# Patient Record
Sex: Female | Born: 1990 | Race: White | Hispanic: No | Marital: Married | Smoking: Never smoker
Health system: Southern US, Community
[De-identification: ages and names within clinical notes are randomized; demographics above are authoritative.]

## PROBLEM LIST (undated history)

## (undated) ENCOUNTER — Inpatient Hospital Stay (HOSPITAL_COMMUNITY): Payer: Self-pay

## (undated) DIAGNOSIS — Z8742 Personal history of other diseases of the female genital tract: Secondary | ICD-10-CM

## (undated) DIAGNOSIS — Z789 Other specified health status: Secondary | ICD-10-CM

## (undated) DIAGNOSIS — R3915 Urgency of urination: Secondary | ICD-10-CM

## (undated) DIAGNOSIS — Z973 Presence of spectacles and contact lenses: Secondary | ICD-10-CM

## (undated) DIAGNOSIS — T8859XA Other complications of anesthesia, initial encounter: Secondary | ICD-10-CM

## (undated) DIAGNOSIS — N201 Calculus of ureter: Secondary | ICD-10-CM

## (undated) DIAGNOSIS — Z8619 Personal history of other infectious and parasitic diseases: Secondary | ICD-10-CM

## (undated) DIAGNOSIS — T4145XA Adverse effect of unspecified anesthetic, initial encounter: Secondary | ICD-10-CM

## (undated) DIAGNOSIS — Z87442 Personal history of urinary calculi: Secondary | ICD-10-CM

## (undated) HISTORY — DX: Personal history of other infectious and parasitic diseases: Z86.19

## (undated) HISTORY — PX: LITHOTRIPSY: SUR834

## (undated) HISTORY — PX: WISDOM TOOTH EXTRACTION: SHX21

## (undated) HISTORY — DX: Personal history of other diseases of the female genital tract: Z87.42

---
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
## 2014-10-29 ENCOUNTER — Encounter: Payer: Self-pay | Admitting: Podiatry

## 2014-10-29 ENCOUNTER — Ambulatory Visit (INDEPENDENT_AMBULATORY_CARE_PROVIDER_SITE_OTHER): Payer: BLUE CROSS/BLUE SHIELD | Admitting: Podiatry

## 2014-10-29 VITALS — BP 76/49 | HR 56 | Resp 18

## 2014-10-29 DIAGNOSIS — L600 Ingrowing nail: Secondary | ICD-10-CM

## 2014-10-29 DIAGNOSIS — L03032 Cellulitis of left toe: Secondary | ICD-10-CM | POA: Diagnosis not present

## 2014-10-29 DIAGNOSIS — L03031 Cellulitis of right toe: Secondary | ICD-10-CM

## 2014-10-29 NOTE — Progress Notes (Signed)
   Subjective:    Patient ID: Crystal Abbott, female    DOB: Nov 01, 1990, 24 y.o.   MRN: [id number]  HPI I HAVE INGROWN TOENAILS ON BOTH BIG TOES AND THEY HAVE BEEN GOING ON FOR ABOUT 6 MONTHS AND THEY DO DRAIN, SORE, TENDER This patient presents with history of ingrowing toenails on the outside border both big toes.  She says she has been unable to self treat these nails and pus is seen coming out from her nails.  She presents for definitive evaluation and treatment.   Review of Systems  Constitutional: Positive for unexpected weight change.  All other systems reviewed and are negative.      Objective:   Physical Exam GENERAL APPEARANCE: Alert, conversant. Appropriately groomed. No acute distress.  VASCULAR: Pedal pulses palpable at 2/4 DP and PT bilateral.  Capillary refill time is immediate to all digits,  Proximal to distal cooling it warm to warm.  Digital hair growth is present bilateral  NEUROLOGIC: sensation is intact epicritically and protectively to 5.07 monofilament at 5/5 sites bilateral.  Light touch is intact bilateral, vibratory sensation intact bilateral, achilles tendon reflex is intact bilateral.  MUSCULOSKELETAL: acceptable muscle strength, tone and stability bilateral.  Intrinsic muscluature intact bilateral.  Rectus appearance of foot and digits noted bilateral.   DERMATOLOGIC: skin color, texture, and turgor are within normal limits.  No preulcerative lesions or ulcers  are seen, no interdigital maceration noted.  No open lesions present.  NAILS  There is marked incurvation along the outside border both great toes with granulation tissue noted..         Assessment & Plan:  Ingrowing Toenail Lateral Borders both Hallux  Paronychia lateral border hallux B/L  IE  Nail surgery both hallux nails.   Treatment options and alternatives discussed.  Recommended an incision and drainage and patient agreed.  Right and left great toes  Were  prepped with alcohol and a 3cc. Of  2%  lidocaine plain was administered in a digital block fashion.  The toes were  then prepped with betadine solution .  The offending nail border was then excised and all necrotic tissue was resected.  The area was then cleansed well and antibiotic ointment and a dry sterile dressing was applied.  The left hallux was performed and the same procedure was then performed on right hallux.  Aftercare instructions were given.  No antibiotics since no infection seen.  Aftercare instructions were given.

## 2014-11-05 ENCOUNTER — Ambulatory Visit (INDEPENDENT_AMBULATORY_CARE_PROVIDER_SITE_OTHER): Payer: BLUE CROSS/BLUE SHIELD | Admitting: Podiatry

## 2014-11-05 ENCOUNTER — Encounter: Payer: Self-pay | Admitting: Podiatry

## 2014-11-05 VITALS — BP 115/65 | HR 91 | Resp 18

## 2014-11-05 DIAGNOSIS — Z09 Encounter for follow-up examination after completed treatment for conditions other than malignant neoplasm: Secondary | ICD-10-CM

## 2014-11-05 NOTE — Progress Notes (Signed)
Subjective:     Patient ID: Crystal Abbott, female   DOB: 1990-05-07, 24 y.o.   MRN: [id number]  HPIShe returns to the office saying her right big toe is doing well but there is drainage and redness in left big toe but nail is better.  She has been soaking and bandaging as directed.  Pain is minimal.     Review of Systems     Objective:   Physical Exam GENERAL APPEARANCE: Alert, conversant. Appropriately groomed. No acute distress.  VASCULAR: Pedal pulses palpable at 2/4 DP and PT bilateral.  Capillary refill time is immediate to all digits,  Proximal to distal cooling it warm to warm.  Digital hair growth is present bilateral  NEUROLOGIC: sensation is intact epicritically and protectively to 5.07 monofilament at 5/5 sites bilateral.  Light touch is intact bilateral, vibratory sensation intact bilateral, achilles tendon reflex is intact bilateral.  MUSCULOSKELETAL: acceptable muscle strength, tone and stability bilateral.  Intrinsic muscluature intact bilateral.  Rectus appearance of foot and digits noted bilateral.   DERMATOLOGIC: skin color, texture, and turgor are within normal limits.  No preulcerative lesions or ulcers  are seen, no interdigital maceration noted.  No open lesions present.  NAILS  There is necrotic tissue along lateral border right big toe with no drainage.  Thee is necrotic tissue along the outside border left hallux with necrotic tissue with clear drainage.      Assessment:     S/p mail surgery     Plan:     ROV  Home instructions given

## 2015-02-01 LAB — OB RESULTS CONSOLE HIV ANTIBODY (ROUTINE TESTING): HIV: NONREACTIVE

## 2015-02-01 LAB — OB RESULTS CONSOLE GC/CHLAMYDIA
CHLAMYDIA, DNA PROBE: NEGATIVE
GC PROBE AMP, GENITAL: NEGATIVE

## 2015-02-01 LAB — OB RESULTS CONSOLE RUBELLA ANTIBODY, IGM: RUBELLA: IMMUNE

## 2015-02-01 LAB — OB RESULTS CONSOLE ABO/RH: RH Type: POSITIVE

## 2015-02-01 LAB — OB RESULTS CONSOLE HEPATITIS B SURFACE ANTIGEN: HEP B S AG: NEGATIVE

## 2015-02-01 LAB — OB RESULTS CONSOLE ANTIBODY SCREEN: Antibody Screen: NEGATIVE

## 2015-02-01 LAB — OB RESULTS CONSOLE RPR: RPR: NONREACTIVE

## 2015-04-07 NOTE — L&D Delivery Note (Signed)
Patient was C/C/+5 and pushed for 5 minutes with epidural.   NSVD  female infant, Apgars 8,9, weight P.   The patient had a tiny first degree perineal laceration repaired with 3-0 vicryl R. Fundus was firm. EBL was expected amount. Placenta was delivered intact. Vagina was clear.  Baby was vigorous and doing skin to skin with mother.  Crystal Abbott A

## 2015-07-06 ENCOUNTER — Encounter (HOSPITAL_COMMUNITY): Payer: Self-pay | Admitting: *Deleted

## 2015-07-06 ENCOUNTER — Inpatient Hospital Stay (HOSPITAL_COMMUNITY)
Admission: AD | Admit: 2015-07-06 | Discharge: 2015-07-06 | Disposition: A | Discharge status: Home / Self Care | Payer: 59 | Source: Ambulatory Visit | Attending: Obstetrics and Gynecology | Admitting: Obstetrics and Gynecology

## 2015-07-06 DIAGNOSIS — Z880 Allergy status to penicillin: Secondary | ICD-10-CM | POA: Insufficient documentation

## 2015-07-06 DIAGNOSIS — K529 Noninfective gastroenteritis and colitis, unspecified: Secondary | ICD-10-CM | POA: Diagnosis not present

## 2015-07-06 DIAGNOSIS — Z3A34 34 weeks gestation of pregnancy: Secondary | ICD-10-CM | POA: Insufficient documentation

## 2015-07-06 DIAGNOSIS — O212 Late vomiting of pregnancy: Secondary | ICD-10-CM | POA: Diagnosis present

## 2015-07-06 DIAGNOSIS — O99613 Diseases of the digestive system complicating pregnancy, third trimester: Secondary | ICD-10-CM | POA: Insufficient documentation

## 2015-07-06 HISTORY — DX: Other specified health status: Z78.9

## 2015-07-06 LAB — URINALYSIS, ROUTINE W REFLEX MICROSCOPIC
BILIRUBIN URINE: NEGATIVE
Glucose, UA: NEGATIVE mg/dL
Hgb urine dipstick: NEGATIVE
KETONES UR: 15 mg/dL — AB
NITRITE: NEGATIVE
PH: 6 (ref 5.0–8.0)
PROTEIN: NEGATIVE mg/dL
Specific Gravity, Urine: 1.02 (ref 1.005–1.030)

## 2015-07-06 LAB — URINE MICROSCOPIC-ADD ON: Bacteria, UA: NONE SEEN

## 2015-07-06 MED ORDER — ONDANSETRON 8 MG PO TBDP
8.0000 mg | ORAL_TABLET | Freq: Once | ORAL | Status: AC
  Administered 2015-07-06: 8 mg via ORAL
  Filled 2015-07-06: qty 1

## 2015-07-06 MED ORDER — ONDANSETRON 4 MG PO TBDP: 4.0000 mg | ORAL_TABLET | Freq: Three times a day (TID) | ORAL | Status: DC | PRN

## 2015-07-06 NOTE — MAU Note (Signed)
N/v/d since 2a

## 2015-07-06 NOTE — MAU Provider Note (Signed)
History     CSN: [id number]  Arrival date and time: 07/06/15 8295   None     Chief Complaint  Patient presents with  . Contractions  . Emesis During Pregnancy  . Diarrhea   HPI   Ms.Cathlin Z Germain is a 25 y.o. female G2P1001 at [redacted]w[redacted]d presenting to MAU with N/V/D that started around 0200.  She has vomited twice, and has had one episode of diarrhea. The patient drove herself her to MAU.   + fetal movement Denies bleeding or leaking of fluid Denies fever  OB History    Gravida Para Term Preterm AB TAB SAB Ectopic Multiple Living   0 0 0 0 0 1      Past Medical History  Diagnosis Date  . Medical history non-contributory     Past Surgical History  Procedure Laterality Date  . Wisdom tooth extraction      No family history on file.  Social History  Substance Use Topics  . Smoking status: Never Smoker   . Smokeless tobacco: Never Used  . Alcohol Use: No    Allergies:  Allergies  Allergen Reactions  . Penicillins Rash    Has patient had a PCN reaction causing immediate rash, facial/tongue/throat swelling, SOB or lightheadedness with hypotension: Yes Has patient had a PCN reaction causing severe rash involving mucus membranes or skin necrosis: Yes Has patient had a PCN reaction that required hospitalization No Has patient had a PCN reaction occurring within the last 10 years: No If all of the above answers are "NO", then may proceed with Cephalosporin use.     Prescriptions prior to admission  Medication Sig Dispense Refill Last Dose  . Prenatal Vit-Fe Fumarate-FA (MULTIVITAMIN-PRENATAL) 27-0.8 MG TABS tablet Take 1 tablet by mouth daily at 12 noon.   07/05/2015 at Unknown time   Recent Results (from the past 2160 hour(s))  Urinalysis, Routine w reflex microscopic (not at [facility])     Status: Abnormal   Collection Time: 07/06/15  9:25 AM  Result Value Ref Range   Color, Urine YELLOW YELLOW   APPearance CLEAR CLEAR   Specific Gravity, Urine 1.020  1.005 - 1.030   pH 6.0 5.0 - 8.0   Glucose, UA NEGATIVE NEGATIVE mg/dL   Hgb urine dipstick NEGATIVE NEGATIVE   Bilirubin Urine NEGATIVE NEGATIVE   Ketones, ur 15 (A) NEGATIVE mg/dL   Protein, ur NEGATIVE NEGATIVE mg/dL   Nitrite NEGATIVE NEGATIVE   Leukocytes, UA SMALL (A) NEGATIVE  Urine microscopic-add on     Status: Abnormal   Collection Time: 07/06/15  9:25 AM  Result Value Ref Range   Squamous Epithelial / LPF 0-5 (A) NONE SEEN   WBC, UA 0-5 0 - 5 WBC/hpf   RBC / HPF 0-5 0 - 5 RBC/hpf   Bacteria, UA NONE SEEN NONE SEEN   Urine-Other MUCOUS PRESENT     Review of Systems  Constitutional: Negative for fever and chills.  Gastrointestinal: Positive for nausea, vomiting, abdominal pain and diarrhea.   Physical Exam   Blood pressure 125/66, pulse 96, temperature 97.6 F (36.4 C), resp. rate 18, height  (1.626 m), weight 237 lb 6.4 oz (107.684 kg).  Physical Exam  Constitutional: She is oriented to person, place, and time. She appears well-developed and well-nourished. No distress.  HENT:  Head: Normocephalic.  GI: Soft. There is no tenderness.  Genitourinary:  Cervix: closed, thick, middle   Neurological: She is alert and oriented to person, place, and time.  Skin: Skin is warm. She is not diaphoretic.  Psychiatric: Her behavior is normal.   Fetal Tracing: Baseline: 130 bpm  Variability: Moderate  Accelerations: 15x15 Decelerations: quick variables  Toco: Quiet   MAU Course  Procedures None  MDM  Zofran 8 mg ODT PO fluid challenge: pass Patient has not vomited since her arrival Discussed HPI, labs, fetal tracing and course of treatment in MAU.   Assessment and Plan   A:  1. Gastroenteritis, acute     P:  Discharge home in stable condition RX: Zofran Follow up with OB as scheduled Fetal kick counts BRAT diet  Increase po fluid intake     Duane LopeJennifer I Rasch, NP 07/06/2015 1:17 PM

## 2015-07-06 NOTE — Discharge Instructions (Signed)

## 2015-07-16 LAB — OB RESULTS CONSOLE GBS: GBS: POSITIVE

## 2015-08-14 ENCOUNTER — Other Ambulatory Visit: Payer: Self-pay | Admitting: Obstetrics and Gynecology

## 2015-08-15 ENCOUNTER — Encounter (HOSPITAL_COMMUNITY): Payer: Self-pay | Admitting: *Deleted

## 2015-08-15 ENCOUNTER — Telehealth (HOSPITAL_COMMUNITY): Payer: Self-pay | Admitting: *Deleted

## 2015-08-16 ENCOUNTER — Inpatient Hospital Stay (HOSPITAL_COMMUNITY)
Admission: AD | Admit: 2015-08-16 | Discharge: 2015-08-17 | DRG: 775 | Disposition: A | Discharge status: Home / Self Care | Payer: 59 | Source: Ambulatory Visit | Attending: Obstetrics and Gynecology | Admitting: Obstetrics and Gynecology

## 2015-08-16 ENCOUNTER — Inpatient Hospital Stay (HOSPITAL_COMMUNITY): Payer: 59 | Admitting: Anesthesiology

## 2015-08-16 ENCOUNTER — Encounter (HOSPITAL_COMMUNITY): Payer: Self-pay | Admitting: *Deleted

## 2015-08-16 DIAGNOSIS — O99824 Streptococcus B carrier state complicating childbirth: Principal | ICD-10-CM | POA: Diagnosis present

## 2015-08-16 DIAGNOSIS — O99334 Smoking (tobacco) complicating childbirth: Secondary | ICD-10-CM | POA: Diagnosis present

## 2015-08-16 DIAGNOSIS — Z3A40 40 weeks gestation of pregnancy: Secondary | ICD-10-CM | POA: Diagnosis not present

## 2015-08-16 DIAGNOSIS — Z3490 Encounter for supervision of normal pregnancy, unspecified, unspecified trimester: Secondary | ICD-10-CM

## 2015-08-16 DIAGNOSIS — Z880 Allergy status to penicillin: Secondary | ICD-10-CM

## 2015-08-16 HISTORY — DX: Encounter for supervision of normal pregnancy, unspecified, unspecified trimester: Z34.90

## 2015-08-16 LAB — CBC
HEMATOCRIT: 32.5 % — AB (ref 36.0–46.0)
HEMOGLOBIN: 10.5 g/dL — AB (ref 12.0–15.0)
MCH: 25.2 pg — AB (ref 26.0–34.0)
MCHC: 32.3 g/dL (ref 30.0–36.0)
MCV: 78.1 fL (ref 78.0–100.0)
Platelets: 236 10*3/uL (ref 150–400)
RBC: 4.16 MIL/uL (ref 3.87–5.11)
RDW: 15.4 % (ref 11.5–15.5)
WBC: 10.3 10*3/uL (ref 4.0–10.5)

## 2015-08-16 LAB — TYPE AND SCREEN
ABO/RH(D): B POS
Antibody Screen: NEGATIVE

## 2015-08-16 LAB — ABO/RH: ABO/RH(D): B POS

## 2015-08-16 MED ORDER — OXYTOCIN 40 UNITS IN LACTATED RINGERS INFUSION - SIMPLE MED
1.0000 m[IU]/min | INTRAVENOUS | Status: DC
  Filled 2015-08-16: qty 1000

## 2015-08-16 MED ORDER — PRENATAL MULTIVITAMIN CH
1.0000 | ORAL_TABLET | Freq: Every day | ORAL | Status: DC
  Administered 2015-08-17: 1 via ORAL
  Filled 2015-08-16 (×2): qty 1

## 2015-08-16 MED ORDER — DIBUCAINE 1 % RE OINT: 1.0000 "application " | TOPICAL_OINTMENT | RECTAL | Status: DC | PRN

## 2015-08-16 MED ORDER — TETANUS-DIPHTH-ACELL PERTUSSIS 5-2.5-18.5 LF-MCG/0.5 IM SUSP: 0.5000 mL | Freq: Once | INTRAMUSCULAR | Status: DC

## 2015-08-16 MED ORDER — OXYCODONE-ACETAMINOPHEN 5-325 MG PO TABS: 1.0000 | ORAL_TABLET | ORAL | Status: DC | PRN

## 2015-08-16 MED ORDER — LIDOCAINE HCL (PF) 1 % IJ SOLN
30.0000 mL | INTRAMUSCULAR | Status: DC | PRN
  Filled 2015-08-16: qty 30

## 2015-08-16 MED ORDER — OXYCODONE-ACETAMINOPHEN 5-325 MG PO TABS: 2.0000 | ORAL_TABLET | ORAL | Status: DC | PRN

## 2015-08-16 MED ORDER — LACTATED RINGERS IV SOLN
INTRAVENOUS | Status: DC
  Administered 2015-08-16: 18:00:00 via INTRAVENOUS

## 2015-08-16 MED ORDER — WITCH HAZEL-GLYCERIN EX PADS: 1.0000 "application " | MEDICATED_PAD | CUTANEOUS | Status: DC | PRN

## 2015-08-16 MED ORDER — FENTANYL 2.5 MCG/ML BUPIVACAINE 1/10 % EPIDURAL INFUSION (WH - ANES)
14.0000 mL/h | INTRAMUSCULAR | Status: DC | PRN
  Administered 2015-08-16 (×2): 14 mL/h via EPIDURAL
  Filled 2015-08-16: qty 125

## 2015-08-16 MED ORDER — COCONUT OIL OIL
1.0000 | TOPICAL_OIL | Status: DC | PRN
Start: 2015-08-16 — End: 2015-08-18

## 2015-08-16 MED ORDER — BISACODYL 10 MG RE SUPP: 10.0000 mg | Freq: Every day | RECTAL | Status: DC | PRN

## 2015-08-16 MED ORDER — SENNOSIDES-DOCUSATE SODIUM 8.6-50 MG PO TABS
2.0000 | ORAL_TABLET | ORAL | Status: DC
  Administered 2015-08-17: 2 via ORAL
  Filled 2015-08-16: qty 2

## 2015-08-16 MED ORDER — LACTATED RINGERS IV SOLN
500.0000 mL | INTRAVENOUS | Status: DC | PRN
  Administered 2015-08-16: 300 mL via INTRAVENOUS

## 2015-08-16 MED ORDER — LIDOCAINE HCL (PF) 1 % IJ SOLN
INTRAMUSCULAR | Status: DC | PRN
  Administered 2015-08-16 (×2): 5 mL

## 2015-08-16 MED ORDER — OXYCODONE HCL 5 MG PO TABS: 10.0000 mg | ORAL_TABLET | ORAL | Status: DC | PRN

## 2015-08-16 MED ORDER — EPHEDRINE 5 MG/ML INJ
10.0000 mg | INTRAVENOUS | Status: DC | PRN
  Filled 2015-08-16: qty 2
  Filled 2015-08-16: qty 4

## 2015-08-16 MED ORDER — OXYTOCIN BOLUS FROM INFUSION
500.0000 mL | INTRAVENOUS | Status: DC
  Administered 2015-08-16: 500 mL via INTRAVENOUS

## 2015-08-16 MED ORDER — SIMETHICONE 80 MG PO CHEW
80.0000 mg | CHEWABLE_TABLET | Freq: Three times a day (TID) | ORAL | Status: DC
  Filled 2015-08-16: qty 1

## 2015-08-16 MED ORDER — OXYTOCIN 40 UNITS IN LACTATED RINGERS INFUSION - SIMPLE MED: 2.5000 [IU]/h | INTRAVENOUS | Status: AC

## 2015-08-16 MED ORDER — CEFAZOLIN SODIUM 1-5 GM-% IV SOLN
1.0000 g | Freq: Three times a day (TID) | INTRAVENOUS | Status: DC
  Filled 2015-08-16: qty 50

## 2015-08-16 MED ORDER — METHYLERGONOVINE MALEATE 0.2 MG/ML IJ SOLN: 0.2000 mg | INTRAMUSCULAR | Status: DC | PRN

## 2015-08-16 MED ORDER — LACTATED RINGERS IV SOLN: INTRAVENOUS | Status: DC

## 2015-08-16 MED ORDER — OXYTOCIN 40 UNITS IN LACTATED RINGERS INFUSION - SIMPLE MED: 2.5000 [IU]/h | INTRAVENOUS | Status: DC

## 2015-08-16 MED ORDER — TERBUTALINE SULFATE 1 MG/ML IJ SOLN
0.2500 mg | Freq: Once | INTRAMUSCULAR | Status: DC | PRN
  Filled 2015-08-16: qty 1

## 2015-08-16 MED ORDER — IBUPROFEN 600 MG PO TABS
600.0000 mg | ORAL_TABLET | Freq: Four times a day (QID) | ORAL | Status: DC
  Administered 2015-08-17 (×4): 600 mg via ORAL
  Filled 2015-08-16 (×4): qty 1

## 2015-08-16 MED ORDER — SIMETHICONE 80 MG PO CHEW: 80.0000 mg | CHEWABLE_TABLET | ORAL | Status: DC | PRN

## 2015-08-16 MED ORDER — CEFAZOLIN SODIUM-DEXTROSE 2-4 GM/100ML-% IV SOLN
2.0000 g | Freq: Once | INTRAVENOUS | Status: AC
  Administered 2015-08-16: 2 g via INTRAVENOUS
  Filled 2015-08-16: qty 100

## 2015-08-16 MED ORDER — DIPHENHYDRAMINE HCL 25 MG PO CAPS: 25.0000 mg | ORAL_CAPSULE | Freq: Four times a day (QID) | ORAL | Status: DC | PRN

## 2015-08-16 MED ORDER — FLEET ENEMA 7-19 GM/118ML RE ENEM: 1.0000 | ENEMA | Freq: Every day | RECTAL | Status: DC | PRN

## 2015-08-16 MED ORDER — MENTHOL 3 MG MT LOZG: 1.0000 | LOZENGE | OROMUCOSAL | Status: DC | PRN

## 2015-08-16 MED ORDER — DIPHENHYDRAMINE HCL 50 MG/ML IJ SOLN: 12.5000 mg | INTRAMUSCULAR | Status: DC | PRN

## 2015-08-16 MED ORDER — CITRIC ACID-SODIUM CITRATE 334-500 MG/5ML PO SOLN: 30.0000 mL | ORAL | Status: DC | PRN

## 2015-08-16 MED ORDER — SIMETHICONE 80 MG PO CHEW
80.0000 mg | CHEWABLE_TABLET | ORAL | Status: DC
  Administered 2015-08-17: 80 mg via ORAL
  Filled 2015-08-16: qty 1

## 2015-08-16 MED ORDER — LACTATED RINGERS IV SOLN: 500.0000 mL | Freq: Once | INTRAVENOUS | Status: DC

## 2015-08-16 MED ORDER — MEASLES, MUMPS & RUBELLA VAC ~~LOC~~ INJ: 0.5000 mL | INJECTION | Freq: Once | SUBCUTANEOUS | Status: DC

## 2015-08-16 MED ORDER — PHENYLEPHRINE 40 MCG/ML (10ML) SYRINGE FOR IV PUSH (FOR BLOOD PRESSURE SUPPORT)
80.0000 ug | PREFILLED_SYRINGE | INTRAVENOUS | Status: DC | PRN
  Filled 2015-08-16: qty 5
  Filled 2015-08-16: qty 10

## 2015-08-16 MED ORDER — PHENYLEPHRINE 40 MCG/ML (10ML) SYRINGE FOR IV PUSH (FOR BLOOD PRESSURE SUPPORT)
80.0000 ug | PREFILLED_SYRINGE | INTRAVENOUS | Status: AC | PRN
  Administered 2015-08-16 (×2): 80 ug via INTRAVENOUS
  Administered 2015-08-16: 40 ug via INTRAVENOUS

## 2015-08-16 MED ORDER — FERROUS SULFATE 325 (65 FE) MG PO TABS
325.0000 mg | ORAL_TABLET | Freq: Two times a day (BID) | ORAL | Status: DC
  Administered 2015-08-17 (×2): 325 mg via ORAL
  Filled 2015-08-16 (×2): qty 1

## 2015-08-16 MED ORDER — ACETAMINOPHEN 325 MG PO TABS: 650.0000 mg | ORAL_TABLET | ORAL | Status: DC | PRN

## 2015-08-16 MED ORDER — EPHEDRINE 5 MG/ML INJ
10.0000 mg | INTRAVENOUS | Status: DC | PRN
  Administered 2015-08-16: 5 mg via INTRAVENOUS
  Filled 2015-08-16: qty 2

## 2015-08-16 MED ORDER — ZOLPIDEM TARTRATE 5 MG PO TABS: 5.0000 mg | ORAL_TABLET | Freq: Every evening | ORAL | Status: DC | PRN

## 2015-08-16 MED ORDER — METHYLERGONOVINE MALEATE 0.2 MG PO TABS: 0.2000 mg | ORAL_TABLET | ORAL | Status: DC | PRN

## 2015-08-16 MED ORDER — OXYCODONE HCL 5 MG PO TABS: 5.0000 mg | ORAL_TABLET | ORAL | Status: DC | PRN

## 2015-08-16 MED ORDER — ONDANSETRON HCL 4 MG/2ML IJ SOLN
4.0000 mg | Freq: Four times a day (QID) | INTRAMUSCULAR | Status: DC | PRN
  Administered 2015-08-16: 4 mg via INTRAVENOUS
  Filled 2015-08-16: qty 2

## 2015-08-16 MED ORDER — ACETAMINOPHEN 325 MG PO TABS
650.0000 mg | ORAL_TABLET | ORAL | Status: DC | PRN
  Administered 2015-08-17: 650 mg via ORAL
  Filled 2015-08-16: qty 2

## 2015-08-16 NOTE — Anesthesia Procedure Notes (Signed)
Epidural Patient location during procedure: OB  Staffing Anesthesiologist: Phillips GroutARIGNAN, Katherinne Mofield Performed by: anesthesiologist   Preanesthetic Checklist Completed: patient identified, site marked, surgical consent, pre-op evaluation, timeout performed, IV checked, risks and benefits discussed and monitors and equipment checked  Epidural Patient position: sitting Prep: DuraPrep Patient monitoring: heart rate, continuous pulse ox and blood pressure Approach: midline Location: L3-L4 Injection technique: LOR saline  Needle:  Needle type: Tuohy  Needle gauge: 17 G Needle length: 9 cm and 9 Needle insertion depth: 8 cm Catheter type: closed end flexible Catheter size: 20 Guage Catheter at skin depth: 12 cm Test dose: negative  Assessment Events: blood not aspirated, injection not painful, no injection resistance, negative IV test and no paresthesia  Additional Notes Patient identified. Risks/Benefits/Options discussed with patient including but not limited to bleeding, infection, nerve damage, paralysis, failed block, incomplete pain control, headache, blood pressure changes, nausea, vomiting, reactions to medication both or allergic, itching and postpartum back pain. Confirmed with bedside nurse the patient's most recent platelet count. Confirmed with patient that they are not currently taking any anticoagulation, have any bleeding history or any family history of bleeding disorders. Patient expressed understanding and wished to proceed. All questions were answered. Sterile technique was used throughout the entire procedure. Please see nursing notes for vital signs. Test dose was given through epidural needle and negative prior to continuing to dose epidural or start infusion. Warning signs of high block given to the patient including shortness of breath, tingling/numbness in hands, complete motor block, or any concerning symptoms with instructions to call for help. Patient was given  instructions on fall risk and not to get out of bed. All questions and concerns addressed with instructions to call with any issues.

## 2015-08-16 NOTE — Progress Notes (Signed)
Informed Dr Carmelia Rollercariganof BP, no new orders, cont current interventions

## 2015-08-16 NOTE — H&P (Signed)
25 y.o. 4836w2d  G2P1001 comes in today for routine visit and ANT for post term.  NST R in office but accompanied by 3 mild to moderate variables- cat 2 tracing.  Since pt is favorable, elected to do induction today.  Otherwise has good fetal movement and no bleeding.  Past Medical History  Diagnosis Date  . Medical history non-contributory   . Hx of chlamydia infection   . Hx of varicella   . Hx of endometriosis     Past Surgical History  Procedure Laterality Date  . Wisdom tooth extraction      OB History  Gravida Para Term Preterm AB SAB TAB Ectopic Multiple Living  2 1 1   0 0 0 0 0 1    # Outcome Date GA Lbr Len/2nd Weight Sex Delivery Anes PTL Lv  2 Current           1 Term 2016 2139w0d  3.799 kg (8 lb 6 oz) M Vag-Spont EPI  Y      Social History   Social History  . Marital Status: Married    Spouse Name: N/A  . Number of Children: N/A  . Years of Education: N/A   Occupational History  . Not on file.   Social History Main Topics  . Smoking status: Never Smoker   . Smokeless tobacco: Never Used  . Alcohol Use: No  . Drug Use: No  . Sexual Activity: Yes   Other Topics Concern  . Not on file   Social History Narrative   Penicillins    Prenatal Transfer Tool  Maternal Diabetes: No Genetic Screening: Declined Maternal Ultrasounds/Referrals: Normal Fetal Ultrasounds or other Referrals:  None Maternal Substance Abuse:  Yes:  Type: Smoker Significant Maternal Medications:  None Significant Maternal Lab Results: None  Other PNC: uncomplicated.    Filed Vitals:   08/16/15 1129  BP: 120/69  Pulse: 104  Temp: 97.8 F (36.6 C)  Resp: 18     Lungs/Cor:  NAD Abdomen:  soft, gravid Ex:  no cords, erythema SVE:  4/60/-2, VTX FHTs:  130s, good STV, NST R Toco:  q 3-5   A/P   Post term pregnancy for induction.  Cat 2 tracing in office.  GBS POS- low risk allergy to PCN- cefazolin.  Ben Habermann A

## 2015-08-16 NOTE — Anesthesia Preprocedure Evaluation (Signed)

## 2015-08-16 NOTE — Lactation Note (Signed)
This note was copied from a baby's chart. Lactation Consultation Note  Patient Name: Crystal Tally Dueiffani Lodes UJWJX'BToday's Date: 08/16/2015 Reason for consult: Initial assessment Baby at 3 hr of life and mom reports bf is going well. She stated bf went well with her older child in the hospital but when they got home she was not making enough milk. Once she started formula the baby was not interested in bf. Discussed supplementing at the breast if that happens again and making an OP apt before D/C. Discussed baby behavior, feeding frequency, baby belly size, voids, wt loss, breast changes, and nipple care. She stated she can manually express, has seen colostrum bilaterally, and has a spoon in the room. Given lactation handouts. Aware of OP services and support group.     Maternal Data Has patient been taught Hand Expression?: Yes Does the patient have breastfeeding experience prior to this delivery?: Yes  Feeding Feeding Type: Breast Fed Length of feed: 5 min  LATCH Score/Interventions Latch: Grasps breast easily, tongue down, lips flanged, rhythmical sucking.  Audible Swallowing: A few with stimulation Intervention(s): Skin to skin  Type of Nipple: Everted at rest and after stimulation  Comfort (Breast/Nipple): Soft / non-tender     Hold (Positioning): No assistance needed to correctly position infant at breast.  LATCH Score: 9  Lactation Tools Discussed/Used WIC Program: No   Consult Status Consult Status: Follow-up Date: 08/17/15 Follow-up type: In-patient    Crystal Abbott 08/16/2015, 11:06 PM

## 2015-08-17 LAB — CBC
HEMATOCRIT: 30 % — AB (ref 36.0–46.0)
HEMOGLOBIN: 9.5 g/dL — AB (ref 12.0–15.0)
MCH: 24.8 pg — AB (ref 26.0–34.0)
MCHC: 31.7 g/dL (ref 30.0–36.0)
MCV: 78.3 fL (ref 78.0–100.0)
Platelets: 184 10*3/uL (ref 150–400)
RBC: 3.83 MIL/uL — ABNORMAL LOW (ref 3.87–5.11)
RDW: 15.6 % — ABNORMAL HIGH (ref 11.5–15.5)
WBC: 11.4 10*3/uL — ABNORMAL HIGH (ref 4.0–10.5)

## 2015-08-17 LAB — SYPHILIS: RPR W/REFLEX TO RPR TITER AND TREPONEMAL ANTIBODIES, TRADITIONAL SCREENING AND DIAGNOSIS ALGORITHM: RPR Ser Ql: NONREACTIVE

## 2015-08-17 NOTE — Lactation Note (Signed)
This note was copied from a baby's chart. Lactation Consultation Note  Patient Name: Crystal Abbott ZOXWR'UToday's Date: 08/17/2015 Reason for consult: Follow-up assessment Baby at 22 hr of life and mom is worried about latch. She stated the baby has been very sleepy. Baby will not latch or take expressed milk from a spoon. Baby was cueing upon entry and suggested mom latch her. Mom was able to get baby latched in a cross cradle position. When she tried latching baby in cradle position, baby would slip off the nipple. Encouraged mom to keep baby more supported and compress the breast for a deeper latch. Mom stated her nipples are sore, no skin break down noted. Discussed baby behavior, feeding frequency, baby belly size, voids, wt loss, breast changes, and nipple care. Mom can easily express large drops of colostrum. Made her an OP apt for 08/21/15 at 1430. She is aware of lactation services and support group.     Maternal Data    Feeding Feeding Type: Breast Fed Length of feed: 10 min  LATCH Score/Interventions Latch: Repeated attempts needed to sustain latch, nipple held in mouth throughout feeding, stimulation needed to elicit sucking reflex. Intervention(s): Adjust position;Assist with latch;Breast massage;Breast compression  Audible Swallowing: Spontaneous and intermittent Intervention(s): Skin to skin;Hand expression Intervention(s): Alternate breast massage  Type of Nipple: Everted at rest and after stimulation  Comfort (Breast/Nipple): Filling, red/small blisters or bruises, mild/mod discomfort  Problem noted: Mild/Moderate discomfort Interventions (Mild/moderate discomfort): Hand expression  Hold (Positioning): Assistance needed to correctly position infant at breast and maintain latch. Intervention(s): Support Pillows;Position options  LATCH Score: 7  Lactation Tools Discussed/Used     Consult Status Consult Status: Follow-up Date: 08/18/15 Follow-up type:  In-patient    Crystal Abbott 08/17/2015, 5:11 PM

## 2015-08-17 NOTE — Progress Notes (Signed)
Patient is eating, ambulating, voiding.  Pain control is good.  Filed Vitals:   08/16/15 2106 08/16/15 2159 08/17/15 0105 08/17/15 0910  BP: 121/58 115/59 109/54 100/59  Pulse: 91 69 75 56  Temp: 98.1 F (36.7 C) 98.5 F (36.9 C) 97.6 F (36.4 C) 97.6 F (36.4 C)  TempSrc: Oral Oral Oral   Resp: 18 20 18 18   Height:      Weight:      SpO2: 99% 99% 98%     Fundus firm Perineum without swelling.  Lab Results  Component Value Date   WBC 11.4* 08/17/2015   HGB 9.5* 08/17/2015   HCT 30.0* 08/17/2015   MCV 78.3 08/17/2015   PLT 184 08/17/2015    --/--/B POS, B POS (05/12 1155)/RI  A/P Post partum day 1.  Routine care.  Expect d/c routine.    Breauna Mazzeo A

## 2015-08-17 NOTE — Discharge Instructions (Signed)
Iron-Rich Diet ° °Iron is a mineral that helps your body to produce hemoglobin. Hemoglobin is a protein in your red blood cells that carries oxygen to your body's tissues. Eating too little iron may cause you to feel weak and tired, and it can increase your risk for infection. Eating enough iron is necessary for your body's metabolism, muscle function, and nervous system. °Iron is naturally found in many foods. It can also be added to foods or fortified in foods. There are two types of dietary iron: °· Heme iron. Heme iron is absorbed by the body more easily than nonheme iron. Heme iron is found in meat, poultry, and fish. °· Nonheme iron. Nonheme iron is found in dietary supplements, iron-fortified grains, beans, and vegetables. °You may need to follow an iron-rich diet if: °· You have been diagnosed with iron deficiency or iron-deficiency anemia. °· You have a condition that prevents you from absorbing dietary iron, such as: °¨ Infection in your intestines. °¨ Celiac disease. This involves long-lasting (chronic) inflammation of your intestines. °· You do not eat enough iron. °· You eat a diet that is high in foods that impair iron absorption. °· You have lost a lot of blood. °· You have heavy bleeding during your menstrual cycle. °· You are pregnant. °WHAT IS MY PLAN? °Your health care provider may help you to determine how much iron you need per day based on your condition. Generally, when a person consumes sufficient amounts of iron in the diet, the following iron needs are met: °· Men. °¨ 14-18 years old: 11 mg per day. °¨ 19-50 years old: 8 mg per day. °· Women.   °¨ 14-18 years old: 15 mg per day. °¨ 19-50 years old: 18 mg per day. °¨ Over 50 years old: 8 mg per day. °¨ Pregnant women: 27 mg per day. °¨ Breastfeeding women: 9 mg per day. °WHAT DO I NEED TO KNOW ABOUT AN IRON-RICH DIET? °· Eat fresh fruits and vegetables that are high in vitamin C along with foods that are high in iron. This will help increase  the amount of iron that your body absorbs from food, especially with foods containing nonheme iron. Foods that are high in vitamin C include oranges, peppers, tomatoes, and mango. °· Take iron supplements only as directed by your health care provider. Overdose of iron can be life-threatening. If you were prescribed iron supplements, take them with orange juice or a vitamin C supplement. °· Cook foods in pots and pans that are made from iron.   °· Eat nonheme iron-containing foods alongside foods that are high in heme iron. This helps to improve your iron absorption.   °· Certain foods and drinks contain compounds that impair iron absorption. Avoid eating these foods in the same meal as iron-rich foods or with iron supplements. These include: °¨ Coffee, black tea, and red wine. °¨ Milk, dairy products, and foods that are high in calcium. °¨ Beans, soybeans, and peas. °¨ Whole grains. °· When eating foods that contain both nonheme iron and compounds that impair iron absorption, follow these tips to absorb iron better.   °¨ Soak beans overnight before cooking. °¨ Soak whole grains overnight and drain them before using. °¨ Ferment flours before baking, such as using yeast in bread dough. °WHAT FOODS CAN I EAT? °Grains  °Iron-fortified breakfast cereal. Iron-fortified whole-wheat bread. Enriched rice. Sprouted grains. °Vegetables  °Spinach. Potatoes with skin. Green peas. Broccoli. Red and green bell peppers. Fermented vegetables. °Fruits  °Prunes. Raisins. Oranges. Strawberries. Mango. Grapefruit. °Meats and Other   Protein Sources  °Beef liver. Oysters. Beef. Shrimp. Turkey. Chicken. Tuna. Sardines. Chickpeas. Nuts. Tofu. °Beverages  °Tomato juice. Fresh orange juice. Prune juice. Hibiscus tea. Fortified instant breakfast shakes. °Condiments  °Tahini. Fermented soy sauce.  °Sweets and Desserts  °Black-strap molasses.  °Other  °Wheat germ. °The items listed above may not be a complete list of recommended foods or  beverages. Contact your dietitian for more options.  °WHAT FOODS ARE NOT RECOMMENDED? °Grains  °Whole grains. Bran cereal. Bran flour. Oats. °Vegetables  °Artichokes. Brussels sprouts. Kale. °Fruits  °Blueberries. Raspberries. Strawberries. Figs. °Meats and Other Protein Sources  °Soybeans. Products made from soy protein. °Dairy  °Milk. Cream. Cheese. Yogurt. Cottage cheese. °Beverages  °Coffee. Black tea. Red wine. °Sweets and Desserts  °Cocoa. Chocolate. Ice cream. °Other  °Basil. Oregano. Parsley. °The items listed above may not be a complete list of foods and beverages to avoid. Contact your dietitian for more information.  °  °This information is not intended to replace advice given to you by your health care provider. Make sure you discuss any questions you have with your health care provider. °  °Document Released: 11/04/2004 Document Revised: 04/13/2014 Document Reviewed: 10/18/2013 °Elsevier Interactive Patient Education ©2016 [employer] ° ° ° ° °

## 2015-08-17 NOTE — Discharge Summary (Signed)
Obstetric Discharge Summary Reason for Admission: induction of labor Prenatal Procedures: NST Intrapartum Procedures: spontaneous vaginal delivery Postpartum Procedures: none Complications-Operative and Postpartum: 1 degree perineal laceration HEMOGLOBIN  Date Value Ref Range Status  08/17/2015 9.5* 12.0 - 15.0 g/dL Final   HCT  Date Value Ref Range Status  08/17/2015 30.0* 36.0 - 46.0 % Final    Discharge Diagnoses: Term Pregnancy-delivered  Discharge Information: Date: 08/17/2015 Activity: pelvic rest Diet: routine Medications: Ibuprofen and Iron Condition: stable Instructions: refer to practice specific booklet Discharge to: home Follow-up Information    Follow up with Dedria Endres A, MD In 4 weeks.   Specialty:  Obstetrics and Gynecology   Contact information:   [address] [place] [id number] [phone]       Newborn Data: Live born female  Birth Weight: 8 lb 6 oz (3800 g) APGAR: 8, 9  Home with mother.  Jeffrey Voth A 08/17/2015, 9:47 AM

## 2015-08-18 NOTE — Anesthesia Postprocedure Evaluation (Signed)
Anesthesia Post Note  Patient: Crystal Abbott  Procedure(s) Performed: * No procedures listed *  Patient location during evaluation: Mother Baby Anesthesia Type: Epidural Level of consciousness: awake and alert Pain management: pain level controlled Vital Signs Assessment: post-procedure vital signs reviewed and stable Respiratory status: spontaneous breathing, nonlabored ventilation and respiratory function stable Cardiovascular status: stable Postop Assessment: no headache, no backache and epidural receding Anesthetic complications: no Comments: Patient discharged 5-13. No complications noted on chart review.     Last Vitals: There were no vitals filed for this visit.  Last Pain: There were no vitals filed for this visit. Pain Goal:                 Ronnell Clinger J

## 2015-08-21 ENCOUNTER — Ambulatory Visit (HOSPITAL_COMMUNITY): Admit: 2015-08-21 | Payer: 59

## 2015-08-21 ENCOUNTER — Inpatient Hospital Stay (HOSPITAL_COMMUNITY): Admission: RE | Admit: 2015-08-21 | Payer: 59 | Source: Ambulatory Visit

## 2017-09-17 NOTE — Telephone Encounter (Signed)
Preadmission screen  

## 2019-11-10 ENCOUNTER — Other Ambulatory Visit: Payer: Self-pay | Admitting: Urology

## 2019-11-13 ENCOUNTER — Other Ambulatory Visit (HOSPITAL_COMMUNITY)
Admission: RE | Admit: 2019-11-13 | Discharge: 2019-11-13 | Disposition: A | Discharge status: Home / Self Care | Payer: 59 | Source: Ambulatory Visit | Attending: Urology | Admitting: Urology

## 2019-11-13 ENCOUNTER — Encounter (HOSPITAL_BASED_OUTPATIENT_CLINIC_OR_DEPARTMENT_OTHER): Payer: Self-pay | Admitting: Urology

## 2019-11-13 DIAGNOSIS — Z01812 Encounter for preprocedural laboratory examination: Secondary | ICD-10-CM | POA: Diagnosis present

## 2019-11-13 DIAGNOSIS — Z20822 Contact with and (suspected) exposure to covid-19: Secondary | ICD-10-CM | POA: Insufficient documentation

## 2019-11-13 LAB — SARS CORONAVIRUS 2 (TAT 6-24 HRS): SARS Coronavirus 2: NEGATIVE

## 2019-11-13 NOTE — Progress Notes (Signed)
Left phone message for patient to call for pre-procedure instructions.

## 2019-11-13 NOTE — Progress Notes (Signed)
Patient to arrive at 1430 on 11/16/2019. History and medications reviewed. All pre-procedure instructions given. NPO after MN except clear liquids until 7AM DOS. Driver secured.

## 2019-11-16 ENCOUNTER — Ambulatory Visit (HOSPITAL_BASED_OUTPATIENT_CLINIC_OR_DEPARTMENT_OTHER)
Admission: RE | Admit: 2019-11-16 | Discharge: 2019-11-16 | Disposition: A | Discharge status: Home / Self Care | Payer: 59 | Attending: Urology | Admitting: Urology

## 2019-11-16 ENCOUNTER — Encounter (HOSPITAL_BASED_OUTPATIENT_CLINIC_OR_DEPARTMENT_OTHER): Payer: Self-pay | Admitting: Urology

## 2019-11-16 ENCOUNTER — Ambulatory Visit (HOSPITAL_COMMUNITY): Payer: 59

## 2019-11-16 ENCOUNTER — Encounter (HOSPITAL_BASED_OUTPATIENT_CLINIC_OR_DEPARTMENT_OTHER)
Admission: RE | Disposition: A | Discharge status: Home / Self Care | Payer: Self-pay | Source: Home / Self Care | Attending: Urology

## 2019-11-16 DIAGNOSIS — Z87442 Personal history of urinary calculi: Secondary | ICD-10-CM | POA: Diagnosis not present

## 2019-11-16 DIAGNOSIS — Z841 Family history of disorders of kidney and ureter: Secondary | ICD-10-CM | POA: Insufficient documentation

## 2019-11-16 DIAGNOSIS — N200 Calculus of kidney: Secondary | ICD-10-CM | POA: Insufficient documentation

## 2019-11-16 HISTORY — PX: EXTRACORPOREAL SHOCK WAVE LITHOTRIPSY: SHX1557

## 2019-11-16 LAB — POCT PREGNANCY, URINE: Preg Test, Ur: NEGATIVE

## 2019-11-16 SURGERY — LITHOTRIPSY, ESWL
Anesthesia: LOCAL | Laterality: Right

## 2019-11-16 MED ORDER — CIPROFLOXACIN HCL 500 MG PO TABS
500.0000 mg | ORAL_TABLET | ORAL | Status: AC
  Administered 2019-11-16: 500 mg via ORAL

## 2019-11-16 MED ORDER — DIAZEPAM 5 MG PO TABS
10.0000 mg | ORAL_TABLET | ORAL | Status: AC
  Administered 2019-11-16: 10 mg via ORAL

## 2019-11-16 MED ORDER — SODIUM CHLORIDE 0.9 % IV SOLN: INTRAVENOUS | Status: DC

## 2019-11-16 MED ORDER — DIPHENHYDRAMINE HCL 25 MG PO CAPS
ORAL_CAPSULE | ORAL | Status: AC
  Filled 2019-11-16: qty 1

## 2019-11-16 MED ORDER — CIPROFLOXACIN HCL 500 MG PO TABS
ORAL_TABLET | ORAL | Status: AC
  Filled 2019-11-16: qty 1

## 2019-11-16 MED ORDER — DIPHENHYDRAMINE HCL 25 MG PO CAPS
25.0000 mg | ORAL_CAPSULE | ORAL | Status: AC
  Administered 2019-11-16: 25 mg via ORAL

## 2019-11-16 MED ORDER — TRAMADOL HCL 50 MG PO TABS
ORAL_TABLET | ORAL | Status: AC
  Filled 2019-11-16: qty 2

## 2019-11-16 MED ORDER — TRAMADOL HCL 50 MG PO TABS
100.0000 mg | ORAL_TABLET | Freq: Once | ORAL | Status: AC
  Administered 2019-11-16: 100 mg via ORAL

## 2019-11-16 MED ORDER — DIAZEPAM 5 MG PO TABS
ORAL_TABLET | ORAL | Status: AC
  Filled 2019-11-16: qty 2

## 2019-11-16 NOTE — Discharge Instructions (Signed)
See [facility] discharge instructions in chart.    Post Anesthesia Home Care Instructions  Activity: Get plenty of rest for the remainder of the day. A responsible individual must stay with you for 24 hours following the procedure.  For the next 24 hours, DO NOT: -Drive a car -Operate machinery -Drink alcoholic beverages -Take any medication unless instructed by your physician -Make any legal decisions or sign important papers.  Meals: Start with liquid foods such as gelatin or soup. Progress to regular foods as tolerated. Avoid greasy, spicy, heavy foods. If nausea and/or vomiting occur, drink only clear liquids until the nausea and/or vomiting subsides. Call your physician if vomiting continues.      

## 2019-11-16 NOTE — Op Note (Signed)
See [facility] OP note scanned into chart. Also because of the size, density, location and other factors that cannot be anticipated I feel this will likely be a staged procedure. This fact supersedes any indication in the scanned [facility] operative note to the contrary.  

## 2019-11-16 NOTE — H&P (Signed)
CC: Kidney stones   HPI: Crystal Abbott is a 29 year old female who was recently found to have bilateral non-obstructing renal stones and a right sided extra renal pelvis on CT from 11/02/19 during a work-up for right lower quadrant/flank pain. Her right extrarenal pelvic and renal stones are stable in size and location compared to her CT from 05/2018.   Today, the patient reports intermittent episodes of sharp, non-radiating right flank pain associated with nausea. She denies fever/chills, dysuria or hematuria. She has a prior history of kidney stones having passed one several years ago, but has never required surgery. No significant history of GI disorders, recurrent UTIs or gout and does not take any medications at baseline. She notes a family history of kidney stones involving her father.     ALLERGIES: Pennicillin    MEDICATIONS: None   GU PSH: None   NON-GU PSH: None   GU PMH: None   NON-GU PMH: None   FAMILY HISTORY: 1 Daughter - No Family History 1 son - No Family History Kidney Stones - Father   SOCIAL HISTORY: Marital Status: Married Preferred Language: English; Race: White Current Smoking Status: Patient does not smoke anymore.   Tobacco Use Assessment Completed: Used Tobacco in last 30 days? Drinks 4 drinks per month.     REVIEW OF SYSTEMS:    GU Review Female:   Patient reports leakage of urine. Patient denies frequent urination, hard to postpone urination, burning /pain with urination, get up at night to urinate, stream starts and stops, trouble starting your stream, have to strain to urinate, and being pregnant.  Gastrointestinal (Upper):   Patient reports nausea and vomiting. Patient denies indigestion/ heartburn.  Gastrointestinal (Lower):   Patient denies diarrhea and constipation.  Constitutional:   Patient denies fever, night sweats, weight loss, and fatigue.  Skin:   Patient denies skin rash/ lesion and itching.  Eyes:   Patient denies blurred vision and double  vision.  Ears/ Nose/ Throat:   Patient denies sore throat and sinus problems.  Hematologic/Lymphatic:   Patient denies swollen glands and easy bruising.  Cardiovascular:   Patient denies leg swelling and chest pains.  Respiratory:   Patient denies cough and shortness of breath.  Endocrine:   Patient denies excessive thirst.  Musculoskeletal:   Patient reports back pain. Patient denies joint pain.  Neurological:   Patient denies headaches and dizziness.  Psychologic:   Patient denies depression and anxiety.   Notes: history of kidney stones , R side flank pain     VITAL SIGNS:      11/09/2019 03:04 PM  Weight 265 lb / 120.2 kg  Height 66 in / 167.64 cm  BP 111/73 mmHg  Heart Rate 89 /min  Temperature 97.7 F / 36.5 C  BMI 42.8 kg/m   MULTI-SYSTEM PHYSICAL EXAMINATION:    Constitutional: Well-nourished. No physical deformities. Normally developed. Good grooming.  Neurologic / Psychiatric: Oriented to time, oriented to place, oriented to person. No depression, no anxiety, no agitation.  Musculoskeletal: Normal gait and station of head and neck.     Complexity of Data:  X-Ray Review: C.T. Abdomen/Pelvis: Reviewed Films. Reviewed Report. Discussed With Patient.    Notes:                     CLINICAL DATA: Right lower quadrant pain   EXAM:  CT ABDOMEN AND PELVIS WITH CONTRAST   TECHNIQUE:  Multidetector CT imaging of the abdomen and pelvis was performed  using  the standard protocol following bolus administration of  intravenous contrast.   CONTRAST: May 12, 2018   COMPARISON: None.   FINDINGS:  Lower chest: The visualized heart size within normal limits. No  pericardial fluid/thickening.   No hiatal hernia.   The visualized portions of the lungs are clear.   Hepatobiliary: The liver is normal in density without focal  abnormality.The main portal vein is patent. No evidence of calcified  gallstones, gallbladder wall thickening or biliary dilatation.   Pancreas:  Unremarkable. No pancreatic ductal dilatation or  surrounding inflammatory changes.   Spleen: Normal in size without focal abnormality.   Adrenals/Urinary Tract: Both adrenal glands appear normal. There is  a 8 mm calculus seen in the lower pole of the right kidney. A  clustered 7 mm calculus seen within the upper pole of the left  kidney. Again noted is a right-sided extrarenal pelvis. Bladder is  unremarkable.   Stomach/Bowel: The stomach, small bowel, and colon are normal in  appearance. No inflammatory changes, wall thickening, or obstructive  findings.   Vascular/Lymphatic: There are no enlarged mesenteric,  retroperitoneal, or pelvic lymph nodes. No significant vascular  findings are present.   Reproductive: IUD seen within the endometrial canal.   Other: No evidence of abdominal wall mass or hernia.   Musculoskeletal: No acute or significant osseous findings.   IMPRESSION:  Bilateral nonobstructing renal calculi.   No other acute intra-abdominal or pelvic pathology to explain the  patient's symptoms.    Electronically Signed  By: Jonna Clark M.D.  On: 11/02/2019 02:57   PROCEDURES:         KUB - [id number]  A single view of the abdomen is obtained.      Patient confirmed No Neulasta OnPro Device.  Calcifications noted in each renal shadow, consistent with the stones seen on recent CT. No calcifications are seen along the course of either renal shadow. No calcifications noted within bladder. No boney abnormalities. No abnormal bowel/gas patterns or signs of free air.          Urinalysis w/Scope Dipstick Dipstick Cont'd Micro  Color: Yellow Bilirubin: Neg mg/dL WBC/hpf: 0 - 5/hpf  Appearance: Slightly Cloudy Ketones: Neg mg/dL RBC/hpf: 0 - 2/hpf  Specific Gravity: 1.020 Blood: Neg ery/uL Bacteria: NS (Not Seen)  pH: 5.5 Protein: Neg mg/dL Cystals: NS (Not Seen)  Glucose: Neg mg/dL Urobilinogen: 0.2 mg/dL Casts: NS (Not Seen)    Nitrites: Neg Trichomonas: Not  Present    Leukocyte Esterase: Neg leu/uL Mucous: Not Present      Epithelial Cells: 0 - 5/hpf      Yeast: NS (Not Seen)      Sperm: Not Present    ASSESSMENT:      ICD-10 Details  1 GU:   Renal calculus - N20.0 Bilateral, Undiagnosed New Problem  2   Flank Pain - R10.84 Right, Undiagnosed New Problem   PLAN:           Orders Labs Urine Culture  X-Rays: KUB          Schedule Return Visit/Planned Activity: ASAP - Schedule Surgery          Document Letter(s):  Created for Tanvir A. Chodri, MD   Created for Patient: Clinical Summary         Notes:   The risks, benefits and alternatives of RIGHT ESWL was discussed with the patient. I described the risks which include arrhythmia, kidney contusion, kidney hemorrhage, need for transfusion, back discomfort, flank ecchymosis, flank abrasion,  inability to break up stone, inability to pass stone fragments, Steinstrasse, infection associated with obstructing stones, need for different surgical procedure and possible need for repeat shockwave lithotripsy. The patient voices understanding and wishes to proceed.

## 2019-11-17 ENCOUNTER — Inpatient Hospital Stay (HOSPITAL_COMMUNITY): Payer: 59 | Admitting: Certified Registered Nurse Anesthetist

## 2019-11-17 ENCOUNTER — Ambulatory Visit (HOSPITAL_COMMUNITY)
Admission: AD | Admit: 2019-11-17 | Discharge: 2019-11-17 | Disposition: A | Discharge status: Home / Self Care | Payer: 59 | Source: Ambulatory Visit | Attending: Urology | Admitting: Urology

## 2019-11-17 ENCOUNTER — Encounter (HOSPITAL_BASED_OUTPATIENT_CLINIC_OR_DEPARTMENT_OTHER): Payer: Self-pay | Admitting: Urology

## 2019-11-17 ENCOUNTER — Encounter (HOSPITAL_COMMUNITY)
Admission: AD | Disposition: A | Discharge status: Home / Self Care | Payer: Self-pay | Source: Ambulatory Visit | Attending: Urology

## 2019-11-17 ENCOUNTER — Inpatient Hospital Stay (HOSPITAL_COMMUNITY): Payer: 59

## 2019-11-17 ENCOUNTER — Other Ambulatory Visit: Payer: Self-pay | Admitting: Urology

## 2019-11-17 DIAGNOSIS — Z87891 Personal history of nicotine dependence: Secondary | ICD-10-CM | POA: Diagnosis not present

## 2019-11-17 DIAGNOSIS — Z6841 Body Mass Index (BMI) 40.0 and over, adult: Secondary | ICD-10-CM | POA: Diagnosis not present

## 2019-11-17 DIAGNOSIS — N201 Calculus of ureter: Secondary | ICD-10-CM | POA: Insufficient documentation

## 2019-11-17 HISTORY — PX: CYSTOSCOPY W/ URETERAL STENT PLACEMENT: SHX1429

## 2019-11-17 HISTORY — DX: Other complications of anesthesia, initial encounter: T88.59XA

## 2019-11-17 HISTORY — DX: Personal history of urinary calculi: Z87.442

## 2019-11-17 SURGERY — CYSTOSCOPY, WITH RETROGRADE PYELOGRAM AND URETERAL STENT INSERTION
Anesthesia: General | Laterality: Right

## 2019-11-17 MED ORDER — DEXAMETHASONE SODIUM PHOSPHATE 10 MG/ML IJ SOLN
INTRAMUSCULAR | Status: AC
  Filled 2019-11-17: qty 1

## 2019-11-17 MED ORDER — PROMETHAZINE HCL 25 MG/ML IJ SOLN: 6.2500 mg | INTRAMUSCULAR | Status: DC | PRN

## 2019-11-17 MED ORDER — MIDAZOLAM HCL 2 MG/2ML IJ SOLN
INTRAMUSCULAR | Status: AC
  Filled 2019-11-17: qty 2

## 2019-11-17 MED ORDER — CIPROFLOXACIN IN D5W 400 MG/200ML IV SOLN
400.0000 mg | INTRAVENOUS | Status: AC
  Administered 2019-11-17: 400 mg via INTRAVENOUS
  Filled 2019-11-17: qty 200

## 2019-11-17 MED ORDER — MIDAZOLAM HCL 2 MG/2ML IJ SOLN
INTRAMUSCULAR | Status: DC | PRN
  Administered 2019-11-17: 2 mg via INTRAVENOUS

## 2019-11-17 MED ORDER — LIDOCAINE 2% (20 MG/ML) 5 ML SYRINGE
INTRAMUSCULAR | Status: AC
  Filled 2019-11-17: qty 5

## 2019-11-17 MED ORDER — FENTANYL CITRATE (PF) 100 MCG/2ML IJ SOLN
INTRAMUSCULAR | Status: AC
  Filled 2019-11-17: qty 2

## 2019-11-17 MED ORDER — FENTANYL CITRATE (PF) 100 MCG/2ML IJ SOLN
INTRAMUSCULAR | Status: DC | PRN
  Administered 2019-11-17 (×2): 50 ug via INTRAVENOUS

## 2019-11-17 MED ORDER — PROPOFOL 10 MG/ML IV BOLUS
INTRAVENOUS | Status: AC
  Filled 2019-11-17: qty 20

## 2019-11-17 MED ORDER — PROPOFOL 10 MG/ML IV BOLUS
INTRAVENOUS | Status: DC | PRN
  Administered 2019-11-17: 180 mg via INTRAVENOUS

## 2019-11-17 MED ORDER — SUCCINYLCHOLINE CHLORIDE 200 MG/10ML IV SOSY
PREFILLED_SYRINGE | INTRAVENOUS | Status: DC | PRN
  Administered 2019-11-17: 120 mg via INTRAVENOUS

## 2019-11-17 MED ORDER — SUCCINYLCHOLINE CHLORIDE 200 MG/10ML IV SOSY
PREFILLED_SYRINGE | INTRAVENOUS | Status: AC
  Filled 2019-11-17: qty 10

## 2019-11-17 MED ORDER — DEXAMETHASONE SODIUM PHOSPHATE 4 MG/ML IJ SOLN
INTRAMUSCULAR | Status: DC | PRN
  Administered 2019-11-17: 10 mg via INTRAVENOUS

## 2019-11-17 MED ORDER — ORAL CARE MOUTH RINSE: 15.0000 mL | Freq: Once | OROMUCOSAL | Status: AC

## 2019-11-17 MED ORDER — ONDANSETRON HCL 4 MG/2ML IJ SOLN
INTRAMUSCULAR | Status: AC
  Filled 2019-11-17: qty 2

## 2019-11-17 MED ORDER — LACTATED RINGERS IV SOLN: INTRAVENOUS | Status: DC

## 2019-11-17 MED ORDER — LIDOCAINE 2% (20 MG/ML) 5 ML SYRINGE
INTRAMUSCULAR | Status: DC | PRN
  Administered 2019-11-17: 100 mg via INTRAVENOUS

## 2019-11-17 MED ORDER — CHLORHEXIDINE GLUCONATE 0.12 % MT SOLN
15.0000 mL | Freq: Once | OROMUCOSAL | Status: AC
  Administered 2019-11-17: 15 mL via OROMUCOSAL

## 2019-11-17 MED ORDER — STERILE WATER FOR IRRIGATION IR SOLN
Status: DC | PRN
  Administered 2019-11-17: 3000 mL

## 2019-11-17 MED ORDER — FENTANYL CITRATE (PF) 100 MCG/2ML IJ SOLN: 25.0000 ug | INTRAMUSCULAR | Status: DC | PRN

## 2019-11-17 MED ORDER — ONDANSETRON HCL 4 MG/2ML IJ SOLN
INTRAMUSCULAR | Status: DC | PRN
  Administered 2019-11-17: 4 mg via INTRAVENOUS

## 2019-11-17 SURGICAL SUPPLY — 15 items
BAG URO CATCHER STRL LF (MISCELLANEOUS) ×3 IMPLANT
CATH INTERMIT  6FR 70CM (CATHETERS) ×3 IMPLANT
CLOTH BEACON ORANGE TIMEOUT ST (SAFETY) ×3 IMPLANT
GAUZE 4X4 16PLY RFD (DISPOSABLE) ×3 IMPLANT
GLOVE BIOGEL M STRL SZ7.5 (GLOVE) ×3 IMPLANT
GOWN STRL REUS W/TWL LRG LVL3 (GOWN DISPOSABLE) ×6 IMPLANT
GUIDEWIRE STR DUAL SENSOR (WIRE) ×3 IMPLANT
GUIDEWIRE ZIPWRE .038 STRAIGHT (WIRE) IMPLANT
KIT TURNOVER KIT A (KITS) IMPLANT
MANIFOLD NEPTUNE II (INSTRUMENTS) ×3 IMPLANT
PACK CYSTO (CUSTOM PROCEDURE TRAY) ×3 IMPLANT
STENT URET 6FRX24 CONTOUR (STENTS) ×3 IMPLANT
TUBING CONNECTING 10 (TUBING) ×2 IMPLANT
TUBING CONNECTING 10' (TUBING) ×1
TUBING UROLOGY SET (TUBING) IMPLANT

## 2019-11-17 NOTE — Transfer of Care (Signed)
Immediate Anesthesia Transfer of Care Note  Patient: Crystal Abbott  Procedure(s) Performed: CYSTOSCOPY WITH RETROGRADE PYELOGRAM/URETERAL STENT PLACEMENT (Right )  Patient Location: PACU  Anesthesia Type:General  Level of Consciousness: awake and patient cooperative  Airway & Oxygen Therapy: Patient Spontanous Breathing and Patient connected to face mask  Post-op Assessment: Report given to RN and Post -op Vital signs reviewed and stable  Post vital signs: Reviewed and stable  Last Vitals:  Vitals Value Taken Time  BP 133/83 11/17/19 1909  Temp    Pulse 95 11/17/19 1910  Resp 20 11/17/19 1910  SpO2 100 % 11/17/19 1910  Vitals shown include unvalidated device data.  Last Pain:  Vitals:   11/17/19 1747  TempSrc:   PainSc: 4       Patients Stated Pain Goal: 2 (11/17/19 1747)  Complications: No complications documented.

## 2019-11-17 NOTE — H&P (Signed)
29 year old female he was urgently evaluated for severe right-sided flank pain with associated nausea and vomiting. She is postop day 1. status post right-sided shockwave lithotripsy for a intermittently obstructing right kidney stone. She tolerated the procedure well, but in the PACU had intense pain. She was discharged home after being given 100 mg tramadol. Since discharge she has had ongoing profound constant flank pain and nausea and vomiting. She has been unable to keep anything down including the medications for her nausea. She denies any fevers or chills. She denies any gross hematuria. She has pain radiating from her right flank down into her right lower quadrant. He denies any dysuria. She has not passed any stones that she knows of.   The patient underwent a KUB prior to her evaluation today. In addition, she was given 60 mg of IM Toradol and 25 mg of IM Phenergen.   The patient is morbidly obese, but has no significant past medical history. She does have a family history of kidney stones, her father is 1 of my patients.     ALLERGIES: Pennicillin    MEDICATIONS: None   GU PSH: None   NON-GU PSH: None   GU PMH: Flank Pain - 11/09/2019 Renal calculus - 11/09/2019    NON-GU PMH: None   FAMILY HISTORY: 1 Daughter - No Family History 1 son - No Family History Kidney Stones - Father   SOCIAL HISTORY: Marital Status: Married Preferred Language: English; Race: White Current Smoking Status: Patient does not smoke anymore.   Tobacco Use Assessment Completed: Used Tobacco in last 30 days? Drinks 4 drinks per month.     REVIEW OF SYSTEMS:    GU Review Female:   Patient denies frequent urination, hard to postpone urination, burning /pain with urination, get up at night to urinate, leakage of urine, stream starts and stops, trouble starting your stream, have to strain to urinate, and being pregnant.  Gastrointestinal (Upper):   Patient reports nausea and vomiting. Patient denies  indigestion/ heartburn.  Gastrointestinal (Lower):   Patient denies diarrhea and constipation.  Constitutional:   Patient denies fever, night sweats, weight loss, and fatigue.  Skin:   Patient denies skin rash/ lesion and itching.  Eyes:   Patient denies blurred vision and double vision.  Ears/ Nose/ Throat:   Patient denies sore throat and sinus problems.  Hematologic/Lymphatic:   Patient denies swollen glands and easy bruising.  Cardiovascular:   Patient denies leg swelling and chest pains.  Respiratory:   Patient denies cough and shortness of breath.  Endocrine:   Patient denies excessive thirst.  Musculoskeletal:   Patient reports back pain. Patient denies joint pain.  Neurological:   Patient denies headaches and dizziness.  Psychologic:   Patient denies depression and anxiety.   Notes: Stomach pain.     VITAL SIGNS:      11/17/2019 02:40 PM  Weight 263 lb / 119.29 kg  Height 66 in / 167.64 cm  BP 138/87 mmHg  Heart Rate 76 /min  Temperature 96.9 F / 36.0 C  BMI 42.4 kg/m   GU PHYSICAL EXAMINATION:      Notes: The patient is in acute distress, but more comfortable after being medicated with Toradol and Phenergen. She is tearful. She is alert and oriented.   Normal heart sounds without evidence of murmur.   Lungs are clear to auscultation bilaterally   Right CVA tenderness to palpation     Complexity of Data:  Records Review:   Previous Doctor Records, Previous  Hospital Records, Previous Patient Records  Urine Test Review:   Urinalysis  X-Ray Review: KUB: Reviewed Films. Discussed With Patient.  C.T. Abdomen/Pelvis: Reviewed Films. Discussed With Patient.     PROCEDURES:         KUB - [id number]  A single view of the abdomen is obtained. Renal shadows are easily visualized bilaterally. The right mid pole stone that was present yesterday is no longer present on today's x-ray. She has 2 small stones in the left kidney. There is a Meredith Mody stress a measuring approximately 1.9  mm by approximately 8.5 mm in the right ureter between L3 and L4. There are no additional calcifications along the expected location of either ureter bilaterally.  Gas pattern is grossly normal. No significant bony abnormalities.      Impression:There is a Meredith Mody stress a measuring approximately 1.9 mm by approximately 8.5 mm in the right ureter between L3 and L4.  Patient confirmed No Neulasta OnPro Device.           Urinalysis w/Scope Dipstick Dipstick Cont'd Micro  Color: Amber Bilirubin: Neg mg/dL WBC/hpf: 6 - 92/ZRA  Appearance: Cloudy Ketones: 1+ mg/dL RBC/hpf: Packed/hpf  Specific Gravity: 1.025 Blood: 3+ ery/uL Bacteria: Many (>50/hpf)  pH: 6.0 Protein: 1+ mg/dL Cystals: NS (Not Seen)  Glucose: Neg mg/dL Urobilinogen: 0.2 mg/dL Casts: NS (Not Seen)    Nitrites: Neg Trichomonas: Not Present    Leukocyte Esterase: 1+ leu/uL Mucous: Not Present      Epithelial Cells: 6 - 10/hpf      Yeast: NS (Not Seen)      Sperm: Not Present         Ketoralac 60mg  - , Q7622 Qty: 60 Adm. By: [id number]  Unit: mg Lot No Lyndal Rainbow  Route: IM Exp. Date 12/05/2020  Freq: None Mfgr.:   Site: Left Buttock         Phenergan 25mg  - 02/04/2021, [id number] Qty: 25 Adm. By:  Unit: mg Lot No [id number]  Route: IM Exp. Date 12/05/2020  Freq: None Mfgr.:   Site: Right Buttock   ASSESSMENT:      ICD-10 Details  1 GU:   Flank Pain - R10.84   2   Ureteral calculus - N20.1    PLAN:           Orders X-Rays: KUB          Schedule         Document Letter(s):  Created for Patient: Clinical Summary         Notes:   Patient is postop day 1 s/p right shockwave lithotripsy for an intermittently obstructing right renal stone. 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 She is having associated renal colic with uncontrollable nausea and vomiting.   I went through her treatment options with her. She was certainly more  comfortable after she was given injection of Toradol and Phenergen. We discussed medical expulsion therapy verses urgent stent placement. The patient is concerned about going through the weekend in her current state and as such is opted for urgent stent placement. I described the procedure for the patient in detail. She understands this is going to be under general anesthesia and while it will alleviate her current pain and associated nausea/vomiting she will likely have some stent discomfort and irritation. I tried to describe these symptoms for her as well. In further, the patient is likely to need subsequent treatment or  removal of the stones. Having gone through all this with the patient she has opted to proceed.

## 2019-11-17 NOTE — Anesthesia Procedure Notes (Signed)
Procedure Name: Intubation Date/Time: 11/17/2019 6:37 PM Performed by: Vanessa , CRNA Pre-anesthesia Checklist: Patient identified, Emergency Drugs available, Suction available and Patient being monitored Patient Re-evaluated:Patient Re-evaluated prior to induction Oxygen Delivery Method: Circle system utilized Preoxygenation: Pre-oxygenation with 100% oxygen Induction Type: IV induction, Rapid sequence and Cricoid Pressure applied Laryngoscope Size: 2 and Miller Grade View: Grade I Tube type: Oral Tube size: 7.0 mm Number of attempts: 1 Airway Equipment and Method: Stylet Placement Confirmation: ETT inserted through vocal cords under direct vision,  positive ETCO2 and breath sounds checked- equal and bilateral Secured at: 21 cm Tube secured with: Tape Dental Injury: Teeth and Oropharynx as per pre-operative assessment

## 2019-11-17 NOTE — Anesthesia Postprocedure Evaluation (Signed)
Anesthesia Post Note  Patient: Jericho A Voight  Procedure(s) Performed: CYSTOSCOPY WITH RETROGRADE PYELOGRAM/URETERAL STENT PLACEMENT (Right )     Patient location during evaluation: PACU Anesthesia Type: General Level of consciousness: awake and alert, awake and oriented Pain management: pain level controlled Vital Signs Assessment: post-procedure vital signs reviewed and stable Respiratory status: spontaneous breathing, nonlabored ventilation, respiratory function stable and patient connected to nasal cannula oxygen Cardiovascular status: blood pressure returned to baseline and stable Postop Assessment: no apparent nausea or vomiting Anesthetic complications: no   No complications documented.  Last Vitals:  Vitals:   11/17/19 1915 11/17/19 1930  BP: 132/79 125/78  Pulse: 89   Resp: 15   Temp:  36.9 C  SpO2: 99% 96%    Last Pain:  Vitals:   11/17/19 1930  TempSrc:   PainSc: 0-No pain                 Cecile Hearing

## 2019-11-17 NOTE — Op Note (Signed)
Preoperative diagnosis:  1. Right ureteral calculi   Postoperative diagnosis:  1. Right ureteral calculi   Procedure:  1. Cystoscopy 2. Right ureteral stent placement (6 x 24 - no string)  Surgeon: Rolly Salter, Montez Hageman. M.D.  Anesthesia: General  Complications: None  EBL: Minimal  Specimens: None  Indication: Crystal Abbott is a 29 y.o. patient with right ureteral calculi and steinstrasse after ESWL yesterday. After reviewing the management options for treatment, he elected to proceed with the above surgical procedure(s). We have discussed the potential benefits and risks of the procedure, side effects of the proposed treatment, the likelihood of the patient achieving the goals of the procedure, and any potential problems that might occur during the procedure or recuperation. Informed consent has been obtained.  Description of procedure:  The patient was taken to the operating room and general anesthesia was induced.  The patient was placed in the dorsal lithotomy position, prepped and draped in the usual sterile fashion, and preoperative antibiotics were administered. A preoperative time-out was performed.   Cystourethroscopy was performed.  The patient's urethra was examined and was normal. The bladder was then systematically examined in its entirety. There was no evidence for any bladder tumors, stones, or other mucosal pathology.    A 0.38 sensor guidewire was then advanced up the right ureter into the renal pelvis under fluoroscopic guidance.  The wire was then backloaded through the cystoscope and a ureteral stent was advance over the wire using Seldinger technique.  The stent was positioned appropriately under fluoroscopic and cystoscopic guidance.  The wire was then removed with an adequate stent curl noted in the renal pelvis as well as in the bladder.  The bladder was then emptied and the procedure ended.  The patient appeared to tolerate the procedure well and without  complications.  The patient was able to be awakened and transferred to the recovery unit in satisfactory condition.    Moody Bruins MD

## 2019-11-17 NOTE — Anesthesia Preprocedure Evaluation (Addendum)
Anesthesia Evaluation  Patient identified by MRN, date of birth, ID band Patient awake    Reviewed: Allergy & Precautions, NPO status , Patient's Chart, lab work & pertinent test results  Airway Mallampati: II  TM Distance: >3 FB Neck ROM: Full    Dental  (+) Teeth Intact, Dental Advisory Given   Pulmonary neg pulmonary ROS,    Pulmonary exam normal breath sounds clear to auscultation       Cardiovascular negative cardio ROS Normal cardiovascular exam Rhythm:Regular Rate:Normal     Neuro/Psych negative neurological ROS     GI/Hepatic negative GI ROS, Neg liver ROS,   Endo/Other  Morbid obesity  Renal/GU Renal disease (RIGHT URETERAL STONE)     Musculoskeletal negative musculoskeletal ROS (+)   Abdominal   Peds  Hematology negative hematology ROS (+)   Anesthesia Other Findings Day of surgery medications reviewed with the patient.  Reproductive/Obstetrics negative OB ROS                             Anesthesia Physical Anesthesia Plan  ASA: III  Anesthesia Plan: General   Post-op Pain Management:    Induction: Intravenous, Rapid sequence and Cricoid pressure planned  PONV Risk Score and Plan: 4 or greater and Midazolam, Dexamethasone and Ondansetron  Airway Management Planned: Oral ETT  Additional Equipment:   Intra-op Plan:   Post-operative Plan: Extubation in OR  Informed Consent: I have reviewed the patients History and Physical, chart, labs and discussed the procedure including the risks, benefits and alternatives for the proposed anesthesia with the patient or authorized representative who has indicated his/her understanding and acceptance.     Dental advisory given  Plan Discussed with: CRNA  Anesthesia Plan Comments:        Anesthesia Quick Evaluation

## 2019-11-17 NOTE — Discharge Instructions (Signed)
1. You may see some blood in the urine and may have some burning with urination for 48-72 hours. You also may notice that you have to urinate more frequently or urgently after your procedure which is normal.  2. You should call should you develop an inability urinate, fever > 101, persistent nausea and vomiting that prevents you from eating or drinking to stay hydrated.  3. If you have a stent, you will likely urinate more frequently and urgently until the stent is removed and you may experience some discomfort/pain in the lower abdomen and flank especially when urinating. You may take pain medication prescribed to you if needed for pain. You may also intermittently have blood in the urine until the stent is removed.        General Anesthesia, Adult, Care After This sheet gives you information about how to care for yourself after your procedure. Your health care provider may also give you more specific instructions. If you have problems or questions, contact your health care provider. What can I expect after the procedure? After the procedure, the following side effects are common:  Pain or discomfort at the IV site.  Nausea.  Vomiting.  Sore throat.  Trouble concentrating.  Feeling cold or chills.  Weak or tired.  Sleepiness and fatigue.  Soreness and body aches. These side effects can affect parts of the body that were not involved in surgery. Follow these instructions at home:  For at least 24 hours after the procedure:  Have a responsible adult stay with you. It is important to have someone help care for you until you are awake and alert.  Rest as needed.  Do not: ? Participate in activities in which you could fall or become injured. ? Drive. ? Use heavy machinery. ? Drink alcohol. ? Take sleeping pills or medicines that cause drowsiness. ? Make important decisions or sign legal documents. ? Take care of children on your own. Eating and drinking  Follow any  instructions from your health care provider about eating or drinking restrictions.  When you feel hungry, start by eating small amounts of foods that are soft and easy to digest (bland), such as toast. Gradually return to your regular diet.  Drink enough fluid to keep your urine pale yellow.  If you vomit, rehydrate by drinking water, juice, or clear broth. General instructions  If you have sleep apnea, surgery and certain medicines can increase your risk for breathing problems. Follow instructions from your health care provider about wearing your sleep device: ? Anytime you are sleeping, including during daytime naps. ? While taking prescription pain medicines, sleeping medicines, or medicines that make you drowsy.  Return to your normal activities as told by your health care provider. Ask your health care provider what activities are safe for you.  Take over-the-counter and prescription medicines only as told by your health care provider.  If you smoke, do not smoke without supervision.  Keep all follow-up visits as told by your health care provider. This is important. Contact a health care provider if:  You have nausea or vomiting that does not get better with medicine.  You cannot eat or drink without vomiting.  You have pain that does not get better with medicine.  You are unable to pass urine.  You develop a skin rash.  You have a fever.  You have redness around your IV site that gets worse. Get help right away if:  You have difficulty breathing.  You have chest pain.    You have blood in your urine or stool, or you vomit blood. Summary  After the procedure, it is common to have a sore throat or nausea. It is also common to feel tired.  Have a responsible adult stay with you for the first 24 hours after general anesthesia. It is important to have someone help care for you until you are awake and alert.  When you feel hungry, start by eating small amounts of foods  that are soft and easy to digest (bland), such as toast. Gradually return to your regular diet.  Drink enough fluid to keep your urine pale yellow.  Return to your normal activities as told by your health care provider. Ask your health care provider what activities are safe for you. This information is not intended to replace advice given to you by your health care provider. Make sure you discuss any questions you have with your health care provider. Document Revised: 03/26/2017 Document Reviewed: 11/06/2016 Elsevier Patient Education  2020 [employer]  

## 2019-11-18 ENCOUNTER — Encounter (HOSPITAL_COMMUNITY): Payer: Self-pay | Admitting: Urology

## 2019-11-27 ENCOUNTER — Encounter (HOSPITAL_BASED_OUTPATIENT_CLINIC_OR_DEPARTMENT_OTHER): Payer: Self-pay | Admitting: Urology

## 2019-11-27 ENCOUNTER — Other Ambulatory Visit: Payer: Self-pay

## 2019-11-27 ENCOUNTER — Other Ambulatory Visit: Payer: Self-pay | Admitting: Urology

## 2019-11-27 ENCOUNTER — Encounter (HOSPITAL_COMMUNITY)
Admission: RE | Admit: 2019-11-27 | Discharge: 2019-11-27 | Disposition: A | Discharge status: Home / Self Care | Payer: 59 | Attending: Urology | Admitting: Urology

## 2019-11-27 DIAGNOSIS — Z01812 Encounter for preprocedural laboratory examination: Secondary | ICD-10-CM | POA: Insufficient documentation

## 2019-11-27 DIAGNOSIS — Z539 Procedure and treatment not carried out, unspecified reason: Secondary | ICD-10-CM | POA: Diagnosis not present

## 2019-11-27 NOTE — Progress Notes (Signed)
Spoke w/ via phone for pre-op interview--- PT Lab needs dos---- Urine preg              Lab results------ no COVID test ------ 11-28-2019 @ 1200 Arrive at ------- 0700 NPO after MN NO Solid Food.  Clear liquids from MN until--- 0600 Medications to take morning of surgery ----- if needed may take one type of pain rx/ nausea rx Diabetic medication ----- n/a Patient Special Instructions ----- n/a Pre-Op special Istructions ----- n/a Patient verbalized understanding of instructions that were given at this phone interview. Patient denies shortness of breath, chest pain, fever, cough at this phone interview.

## 2019-11-28 ENCOUNTER — Other Ambulatory Visit (HOSPITAL_COMMUNITY)
Admission: RE | Admit: 2019-11-28 | Discharge: 2019-11-28 | Disposition: A | Discharge status: Home / Self Care | Payer: 59 | Source: Ambulatory Visit | Attending: Urology | Admitting: Urology

## 2019-11-28 DIAGNOSIS — Z01812 Encounter for preprocedural laboratory examination: Secondary | ICD-10-CM | POA: Diagnosis not present

## 2019-11-28 DIAGNOSIS — Z20822 Contact with and (suspected) exposure to covid-19: Secondary | ICD-10-CM | POA: Diagnosis not present

## 2019-11-28 LAB — SARS CORONAVIRUS 2 (TAT 6-24 HRS): SARS Coronavirus 2: NEGATIVE

## 2019-11-30 NOTE — Anesthesia Preprocedure Evaluation (Addendum)
Anesthesia Evaluation  Patient identified by MRN, date of birth, ID band Patient awake    Reviewed: Allergy & Precautions, NPO status , Patient's Chart, lab work & pertinent test results  History of Anesthesia Complications Negative for: history of anesthetic complications  Airway Mallampati: II  TM Distance: >3 FB Neck ROM: Full    Dental no notable dental hx.    Pulmonary neg pulmonary ROS,    Pulmonary exam normal        Cardiovascular negative cardio ROS Normal cardiovascular exam     Neuro/Psych negative neurological ROS  negative psych ROS   GI/Hepatic negative GI ROS, Neg liver ROS,   Endo/Other  Morbid obesity  Renal/GU Renal stones  negative genitourinary   Musculoskeletal negative musculoskeletal ROS (+)   Abdominal   Peds  Hematology negative hematology ROS (+)   Anesthesia Other Findings Day of surgery medications reviewed with patient.  Reproductive/Obstetrics negative OB ROS                            Anesthesia Physical Anesthesia Plan  ASA: III  Anesthesia Plan: General   Post-op Pain Management:    Induction: Intravenous  PONV Risk Score and Plan: 4 or greater and Treatment may vary due to age or medical condition, Ondansetron, Dexamethasone, Midazolam and Scopolamine patch - Pre-op  Airway Management Planned: LMA  Additional Equipment: None  Intra-op Plan:   Post-operative Plan: Extubation in OR  Informed Consent: I have reviewed the patients History and Physical, chart, labs and discussed the procedure including the risks, benefits and alternatives for the proposed anesthesia with the patient or authorized representative who has indicated his/her understanding and acceptance.     Dental advisory given  Plan Discussed with: CRNA  Anesthesia Plan Comments:        Anesthesia Quick Evaluation

## 2019-12-01 ENCOUNTER — Encounter (HOSPITAL_BASED_OUTPATIENT_CLINIC_OR_DEPARTMENT_OTHER): Payer: Self-pay | Admitting: Urology

## 2019-12-01 ENCOUNTER — Ambulatory Visit (HOSPITAL_BASED_OUTPATIENT_CLINIC_OR_DEPARTMENT_OTHER): Payer: 59 | Admitting: Anesthesiology

## 2019-12-01 ENCOUNTER — Encounter (HOSPITAL_BASED_OUTPATIENT_CLINIC_OR_DEPARTMENT_OTHER)
Admission: RE | Disposition: A | Discharge status: Home / Self Care | Payer: Self-pay | Source: Home / Self Care | Attending: Urology

## 2019-12-01 ENCOUNTER — Ambulatory Visit (HOSPITAL_BASED_OUTPATIENT_CLINIC_OR_DEPARTMENT_OTHER)
Admission: RE | Admit: 2019-12-01 | Discharge: 2019-12-01 | Disposition: A | Discharge status: Home / Self Care | Payer: 59 | Attending: Urology | Admitting: Urology

## 2019-12-01 ENCOUNTER — Other Ambulatory Visit: Payer: Self-pay

## 2019-12-01 DIAGNOSIS — Z6841 Body Mass Index (BMI) 40.0 and over, adult: Secondary | ICD-10-CM | POA: Insufficient documentation

## 2019-12-01 DIAGNOSIS — N202 Calculus of kidney with calculus of ureter: Secondary | ICD-10-CM | POA: Insufficient documentation

## 2019-12-01 DIAGNOSIS — Z87891 Personal history of nicotine dependence: Secondary | ICD-10-CM | POA: Diagnosis not present

## 2019-12-01 DIAGNOSIS — N201 Calculus of ureter: Secondary | ICD-10-CM | POA: Diagnosis present

## 2019-12-01 HISTORY — DX: Urgency of urination: R39.15

## 2019-12-01 HISTORY — DX: Presence of spectacles and contact lenses: Z97.3

## 2019-12-01 HISTORY — DX: Calculus of ureter: N20.1

## 2019-12-01 HISTORY — PX: CYSTOSCOPY/URETEROSCOPY/HOLMIUM LASER: SHX6545

## 2019-12-01 LAB — POCT PREGNANCY, URINE: Preg Test, Ur: NEGATIVE

## 2019-12-01 SURGERY — CYSTOURETEROSCOPY, USING HOLMIUM LASER
Anesthesia: General | Site: Renal | Laterality: Right

## 2019-12-01 MED ORDER — OXYCODONE HCL 5 MG PO TABS: 5.0000 mg | ORAL_TABLET | Freq: Once | ORAL | Status: DC | PRN

## 2019-12-01 MED ORDER — CIPROFLOXACIN IN D5W 400 MG/200ML IV SOLN
INTRAVENOUS | Status: AC
  Filled 2019-12-01: qty 200

## 2019-12-01 MED ORDER — BELLADONNA ALKALOIDS-OPIUM 16.2-60 MG RE SUPP
RECTAL | Status: AC
  Filled 2019-12-01: qty 1

## 2019-12-01 MED ORDER — CIPROFLOXACIN IN D5W 400 MG/200ML IV SOLN
400.0000 mg | INTRAVENOUS | Status: AC
  Administered 2019-12-01: 400 mg via INTRAVENOUS

## 2019-12-01 MED ORDER — PROMETHAZINE HCL 25 MG/ML IJ SOLN: 6.2500 mg | INTRAMUSCULAR | Status: DC | PRN

## 2019-12-01 MED ORDER — LIDOCAINE 2% (20 MG/ML) 5 ML SYRINGE
INTRAMUSCULAR | Status: AC
  Filled 2019-12-01: qty 5

## 2019-12-01 MED ORDER — PROPOFOL 10 MG/ML IV BOLUS
INTRAVENOUS | Status: DC | PRN
  Administered 2019-12-01: 200 mg via INTRAVENOUS

## 2019-12-01 MED ORDER — DEXAMETHASONE SODIUM PHOSPHATE 4 MG/ML IJ SOLN
INTRAMUSCULAR | Status: DC | PRN
  Administered 2019-12-01: 5 mg via INTRAVENOUS

## 2019-12-01 MED ORDER — FENTANYL CITRATE (PF) 100 MCG/2ML IJ SOLN: 25.0000 ug | INTRAMUSCULAR | Status: DC | PRN

## 2019-12-01 MED ORDER — MIDAZOLAM HCL 2 MG/2ML IJ SOLN
INTRAMUSCULAR | Status: AC
  Filled 2019-12-01: qty 2

## 2019-12-01 MED ORDER — SODIUM CHLORIDE 0.9 % IR SOLN
Status: DC | PRN
  Administered 2019-12-01: 3000 mL

## 2019-12-01 MED ORDER — KETOROLAC TROMETHAMINE 30 MG/ML IJ SOLN
INTRAMUSCULAR | Status: DC | PRN
  Administered 2019-12-01: 30 mg via INTRAVENOUS

## 2019-12-01 MED ORDER — FENTANYL CITRATE (PF) 100 MCG/2ML IJ SOLN
INTRAMUSCULAR | Status: AC
  Filled 2019-12-01: qty 2

## 2019-12-01 MED ORDER — LIDOCAINE HCL (CARDIAC) PF 100 MG/5ML IV SOSY: PREFILLED_SYRINGE | INTRAVENOUS | Status: DC | PRN

## 2019-12-01 MED ORDER — LIDOCAINE 2% (20 MG/ML) 5 ML SYRINGE
INTRAMUSCULAR | Status: DC | PRN
  Administered 2019-12-01: 100 mg via INTRAVENOUS

## 2019-12-01 MED ORDER — FENTANYL CITRATE (PF) 100 MCG/2ML IJ SOLN
INTRAMUSCULAR | Status: DC | PRN
  Administered 2019-12-01 (×4): 50 ug via INTRAVENOUS

## 2019-12-01 MED ORDER — MIDAZOLAM HCL 5 MG/5ML IJ SOLN
INTRAMUSCULAR | Status: DC | PRN
  Administered 2019-12-01: 2 mg via INTRAVENOUS

## 2019-12-01 MED ORDER — DEXAMETHASONE SODIUM PHOSPHATE 10 MG/ML IJ SOLN
INTRAMUSCULAR | Status: AC
  Filled 2019-12-01: qty 1

## 2019-12-01 MED ORDER — ONDANSETRON HCL 4 MG/2ML IJ SOLN
INTRAMUSCULAR | Status: DC | PRN
  Administered 2019-12-01: 4 mg via INTRAVENOUS

## 2019-12-01 MED ORDER — ONDANSETRON HCL 4 MG/2ML IJ SOLN
INTRAMUSCULAR | Status: AC
  Filled 2019-12-01: qty 2

## 2019-12-01 MED ORDER — PROPOFOL 10 MG/ML IV BOLUS
INTRAVENOUS | Status: AC
  Filled 2019-12-01: qty 40

## 2019-12-01 MED ORDER — KETOROLAC TROMETHAMINE 30 MG/ML IJ SOLN
INTRAMUSCULAR | Status: AC
  Filled 2019-12-01: qty 1

## 2019-12-01 MED ORDER — OXYCODONE HCL 5 MG/5ML PO SOLN: 5.0000 mg | Freq: Once | ORAL | Status: DC | PRN

## 2019-12-01 MED ORDER — BELLADONNA ALKALOIDS-OPIUM 16.2-60 MG RE SUPP
RECTAL | Status: DC | PRN
Start: 2019-12-01 — End: 2019-12-01
  Administered 2019-12-01: 1 via RECTAL

## 2019-12-01 MED ORDER — LACTATED RINGERS IV SOLN
INTRAVENOUS | Status: DC
  Administered 2019-12-01: 50 mL via INTRAVENOUS

## 2019-12-01 MED ORDER — SCOPOLAMINE 1 MG/3DAYS TD PT72
MEDICATED_PATCH | TRANSDERMAL | Status: AC
  Filled 2019-12-01: qty 1

## 2019-12-01 MED ORDER — SCOPOLAMINE 1 MG/3DAYS TD PT72
1.0000 | MEDICATED_PATCH | Freq: Once | TRANSDERMAL | Status: DC
  Administered 2019-12-01: 1.5 mg via TRANSDERMAL

## 2019-12-01 MED ORDER — IOHEXOL 300 MG/ML  SOLN
INTRAMUSCULAR | Status: DC | PRN
  Administered 2019-12-01: 20 mL

## 2019-12-01 MED ORDER — ACETAMINOPHEN 500 MG PO TABS
1000.0000 mg | ORAL_TABLET | Freq: Once | ORAL | Status: AC
  Administered 2019-12-01: 1000 mg via ORAL

## 2019-12-01 MED ORDER — ACETAMINOPHEN 500 MG PO TABS
ORAL_TABLET | ORAL | Status: AC
  Filled 2019-12-01: qty 2

## 2019-12-01 SURGICAL SUPPLY — 21 items
BAG DRAIN URO-CYSTO SKYTR STRL (DRAIN) ×2 IMPLANT
BAG DRN UROCATH (DRAIN) ×1
BASKET LASER NITINOL 1.9FR (BASKET) IMPLANT
BASKET STONE 1.7 NGAGE (UROLOGICAL SUPPLIES) ×2 IMPLANT
BSKT STON RTRVL 120 1.9FR (BASKET)
CATH URET 5FR 28IN OPEN ENDED (CATHETERS) ×2 IMPLANT
CATH URET DUAL LUMEN 6-10FR 50 (CATHETERS) IMPLANT
CLOTH BEACON ORANGE TIMEOUT ST (SAFETY) ×2 IMPLANT
EXTRACTOR STONE 1.7FRX115CM (UROLOGICAL SUPPLIES) IMPLANT
FIBER LASER TRAC TIP (UROLOGICAL SUPPLIES) IMPLANT
GLOVE BIO SURGEON STRL SZ7 (GLOVE) ×4 IMPLANT
GLOVE BIO SURGEON STRL SZ7.5 (GLOVE) ×2 IMPLANT
GOWN STRL REUS W/TWL XL LVL3 (GOWN DISPOSABLE) ×4 IMPLANT
GUIDEWIRE STR DUAL SENSOR (WIRE) ×2 IMPLANT
IV NS IRRIG 3000ML ARTHROMATIC (IV SOLUTION) ×2 IMPLANT
KIT TURNOVER CYSTO (KITS) ×2 IMPLANT
MANIFOLD NEPTUNE II (INSTRUMENTS) ×2 IMPLANT
NS IRRIG 500ML POUR BTL (IV SOLUTION) ×2 IMPLANT
PACK CYSTO (CUSTOM PROCEDURE TRAY) ×2 IMPLANT
TUBE CONNECTING 12X1/4 (SUCTIONS) ×2 IMPLANT
TUBING UROLOGY SET (TUBING) ×2 IMPLANT

## 2019-12-01 NOTE — Interval H&P Note (Signed)
History and Physical Interval Note:  12/01/2019 9:19 AM  Crystal Abbott  has presented today for surgery, with the diagnosis of RIGHT STEINSTRAUSS POST LITHOTRIPSY.  The various methods of treatment have been discussed with the patient and family. After consideration of risks, benefits and other options for treatment, the patient has consented to  Procedure(s): RIGHT URETEROSCOPY/HOLMIUM LASER RETROGRADE PYELOGRAM STENT (Right) as a surgical intervention.  The patient's history has been reviewed, patient examined, no change in status, stable for surgery.  I have reviewed the patient's chart and labs.  Questions were answered to the patient's satisfaction.     Crist Fat

## 2019-12-01 NOTE — Anesthesia Procedure Notes (Signed)
Procedure Name: LMA Insertion Date/Time: 12/01/2019 9:32 AM Performed by: Jessica Priest, CRNA Pre-anesthesia Checklist: Patient identified, Emergency Drugs available, Suction available, Patient being monitored and Timeout performed Patient Re-evaluated:Patient Re-evaluated prior to induction Oxygen Delivery Method: Circle system utilized Preoxygenation: Pre-oxygenation with 100% oxygen Induction Type: IV induction Ventilation: Mask ventilation without difficulty LMA: LMA inserted LMA Size: 4.0 Number of attempts: 1 Airway Equipment and Method: Bite block Placement Confirmation: positive ETCO2,  breath sounds checked- equal and bilateral and CO2 detector Tube secured with: Tape Dental Injury: Teeth and Oropharynx as per pre-operative assessment

## 2019-12-01 NOTE — H&P (Signed)
29 year old female he was urgently evaluated for severe right-sided flank pain with associated nausea and vomiting. She is postop day 1. status post right-sided shockwave lithotripsy for a intermittently obstructing right kidney stone. She tolerated the procedure well, but in the PACU had intense pain. She was discharged home after being given 100 mg tramadol. Since discharge she has had ongoing profound constant flank pain and nausea and vomiting. She has been unable to keep anything down including the medications for her nausea. She denies any fevers or chills. She denies any gross hematuria. She has pain radiating from her right flank down into her right lower quadrant. He denies any dysuria. She has not passed any stones that she knows of.   The patient underwent a KUB prior to her evaluation today. In addition, she was given 60 mg of IM Toradol and 25 mg of IM Phenergen.   The patient is morbidly obese, but has no significant past medical history. She does have a family history of kidney stones, her father is 1 of my patients.   11/24/2019: 29 year old female who underwent urgent stent placement on Friday August 13th due to pain from Steinstrasse. She presents today for follow-up. she is wondering what the next steps are. She like to undergo ureteroscopy for definitive stone management she denies fevers, chills. She is tolerating the stent well.     ALLERGIES: Pennicillin    MEDICATIONS: None   GU PSH: ESWL, Right - 11/16/2019     NON-GU PSH: None   GU PMH: Ureteral calculus - 11/17/2019 Flank Pain - 11/09/2019 Renal calculus - 11/09/2019    NON-GU PMH: None   FAMILY HISTORY: 1 Daughter - No Family History 1 son - No Family History Kidney Stones - Father   SOCIAL HISTORY: Marital Status: Married Preferred Language: English; Race: White Current Smoking Status: Patient does not smoke anymore.   Tobacco Use Assessment Completed: Used Tobacco in last 30 days? Drinks 4 drinks per month.      REVIEW OF SYSTEMS:    GU Review Female:   Patient reports frequent urination. Patient denies hard to postpone urination, burning /pain with urination, get up at night to urinate, leakage of urine, stream starts and stops, trouble starting your stream, have to strain to urinate, and being pregnant.  Gastrointestinal (Upper):   Patient denies vomiting, indigestion/ heartburn, and nausea.  Gastrointestinal (Lower):   Patient denies diarrhea and constipation.  Constitutional:   Patient denies fever, night sweats, weight loss, and fatigue.  Skin:   Patient denies skin rash/ lesion and itching.  Eyes:   Patient denies blurred vision and double vision.  Ears/ Nose/ Throat:   Patient denies sore throat and sinus problems.  Hematologic/Lymphatic:   Patient denies swollen glands and easy bruising.  Cardiovascular:   Patient denies leg swelling and chest pains.  Respiratory:   Patient denies cough and shortness of breath.  Endocrine:   Patient denies excessive thirst.  Musculoskeletal:   Patient denies back pain and joint pain.  Neurological:   Patient denies headaches and dizziness.  Psychologic:   Patient denies depression and anxiety.   VITAL SIGNS:      11/24/2019 01:05 PM  Weight 265 lb / 120.2 kg  Height 66 in / 167.64 cm  BP 122/82 mmHg  Pulse 66 /min  Temperature 97.3 F / 36.2 C  BMI 42.8 kg/m   MULTI-SYSTEM PHYSICAL EXAMINATION:    Constitutional: Well-nourished. No physical deformities. Normally developed. Good grooming.  Respiratory: No labored breathing, no use  of accessory muscles.   Cardiovascular: Normal temperature, normal extremity pulses, no swelling, no varicosities.  Lymphatic: No enlargement of neck, axillae, groin.  Skin: No paleness, no jaundice, no cyanosis. No lesion, no ulcer, no rash.  Neurologic / Psychiatric: Oriented to time, oriented to place, oriented to person. No depression, no anxiety, no agitation.  Gastrointestinal: No mass, no tenderness, no  rigidity, non obese abdomen.  Musculoskeletal: Normal gait and station of head and neck.     Complexity of Data:  Source Of History:  Patient, Healthcare Provider, Medical Record Summary  Records Review:   Previous Doctor Records, Previous Patient Records  Urine Test Review:   Urinalysis, Urine Culture  X-Ray Review: KUB: Reviewed Films. Discussed With Patient.  C.T. Abdomen/Pelvis: Reviewed Films. Discussed With Patient.     11/24/19  Urinalysis  Urine Appearance Clear   Urine Color Red   Urine Glucose Neg mg/dL  Urine Bilirubin Neg mg/dL  Urine Ketones Neg mg/dL  Urine Specific Gravity 1.025   Urine Blood 3+ ery/uL  Urine pH 5.5   Urine Protein 3+ mg/dL  Urine Urobilinogen 0.2 mg/dL  Urine Nitrites Neg   Urine Leukocyte Esterase 1+ leu/uL  Urine WBC/hpf 6 - 10/hpf   Urine RBC/hpf >60/hpf   Urine Epithelial Cells 0 - 5/hpf   Urine Bacteria Many (>50/hpf)   Urine Mucous Present   Urine Yeast NS (Not Seen)   Urine Trichomonas Not Present   Urine Cystals NS (Not Seen)   Urine Casts NS (Not Seen)   Urine Sperm Not Present    PROCEDURES:          Urinalysis w/Scope Dipstick Dipstick Cont'd Micro  Color: Red Bilirubin: Neg mg/dL WBC/hpf: 6 - 84/ZYS  Appearance: Clear Ketones: Neg mg/dL RBC/hpf: >06/TKZ  Specific Gravity: 1.025 Blood: 3+ ery/uL Bacteria: Many (>50/hpf)  pH: 5.5 Protein: 3+ mg/dL Cystals: NS (Not Seen)  Glucose: Neg mg/dL Urobilinogen: 0.2 mg/dL Casts: NS (Not Seen)    Nitrites: Neg Trichomonas: Not Present    Leukocyte Esterase: 1+ leu/uL Mucous: Present      Epithelial Cells: 0 - 5/hpf      Yeast: NS (Not Seen)      Sperm: Not Present    ASSESSMENT:      ICD-10 Details  1 GU:   Renal calculus - N20.0    PLAN:           Orders Labs CULTURE, URINE          Schedule Return Visit/Planned Activity: Next Available Appointment - Schedule Surgery          Document Letter(s):  Created for Patient: Clinical Summary         Notes:   Patient would  like to proceed with definitive stone management. This was discussed with Dr. Marlou Porch who she would like to perform the surgery. A surgical pause doing sheet for right-sided ureteroscopy was handed to her his scheduler. Refer benefits of ureteroscopy were discussed in detail today including risk for bleeding, infection and injury to surrounding structures. I also discussed the risks of general anesthesia. She verbalized understanding. Explained that our department would give her a call to schedule upcoming surgery. Return precautions advised in the interval for any worsening symptomatology for fevers, chills, nausea, vomiting.

## 2019-12-01 NOTE — Op Note (Signed)
Preoperative diagnosis: right ureteral calculus  Postoperative diagnosis: right ureteral calculus  Procedure:  1. Cystoscopy 2. right ureteroscopy and stone removal 3. right ureteral stent removal 4. right retrograde pyelography with interpretation  Surgeon: Crist Fat, MD  Anesthesia: General  Complications: None  Intraoperative findings:  #1: The patient had 4 small stone fragments stacked in the midportion of the right ureter which were removed easily without fragmentation using a basket.  There were 2 smaller fragments in the lower pole of the right kidney which were also removed with the basket not requiring any laser fragmentation.  EBL: Minimal  Specimens: 1. right ureteral calculus  Disposition of specimens: [facility] for stone analysis  Indication: Crystal Abbott is a 29 y.o.   patient with a history of a right renal stone that she elected to treat with shockwave lithotripsy.  She subsequently developed a Steinstrasse and underwent stent placement several weeks prior.  She presents today for definitive stone removal. After reviewing the management options for treatment, the patient elected to proceed with the above surgical procedure(s). We have discussed the potential benefits and risks of the procedure, side effects of the proposed treatment, the likelihood of the patient achieving the goals of the procedure, and any potential problems that might occur during the procedure or recuperation. Informed consent has been obtained.   Description of procedure:  The patient was taken to the operating room and general anesthesia was induced.  The patient was placed in the dorsal lithotomy position, prepped and draped in the usual sterile fashion, and preoperative antibiotics were administered. A preoperative time-out was performed.   Cystourethroscopy was performed.  The patient's urethra was examined and was normal. The bladder was then  systematically examined in its entirety. There was no evidence for any bladder tumors, stones, or other mucosal pathology.    Attention then turned to the right ureteral orifice and the stent was grasped that was emanating from it and pulled to the urethral meatus.  I then advanced a sensor wire through the stent and up into the right renal pelvis removing the stent over the wire.  This point I elected not to perform a retrograde pyelogram, but instead used a semirigid ureteroscope to advance it up through the patient's urethra and cannulated the right ureteral orifice.  I advanced it up to the most proximal stone and using a in gage basket I graft the stone and pulled it down through the patient's ureter to the bladder.  On the way I did pick up several other stones that also were removed.  They was one stone that did not come out this way and I went back up the ureter and was able to remove that with the basket.  I subsequently exchanged the semirigid scope for the flexible ureteroscope and advanced the flexible ureteroscope up into the right renal pelvis.  I emptied the pelvis and put some contrast in it and explored the pelvis identifying some small fragments in the lower pole.  I was able to grab these and pull them into the ureter.  Once the 2 fragments were into the ureter I then was able to grasp both of them at the same time with the stone basket and removed them as well.  I then reinserted the cystoscope and removed all the stone fragments which were sent for stone evaluation.  I performed a retrograde pyelogram which demonstrated a normal caliber ureter with no filling defects and no hydronephrosis.  I then emptied the  bladder and repeated fluoroscopy which noted that the ureter was draining easily.  I elected not to replace the stent.  A B&O suppository was placed in the patient's rectum.    The bladder was then emptied and the procedure ended.  The patient appeared to tolerate the procedure well  and without complications.  The patient was able to be awakened and transferred to the recovery unit in satisfactory condition.   Disposition: The patient has been scheduled for followup in 6 weeks with a renal ultrasound.

## 2019-12-01 NOTE — Transfer of Care (Signed)
Immediate Anesthesia Transfer of Care Note  Patient: Crystal Abbott  Procedure(s) Performed: Procedure(s) (LRB): RIGHT URETEROSCOPY ,  RETROGRADE PYELOGRAM ,STENT REMOVAL , STONE MANIPULATION (Right)  Patient Location: PACU  Anesthesia Type: General  Level of Consciousness: awake, sedated, patient cooperative and responds to stimulation  Airway & Oxygen Therapy: Patient Spontanous Breathing and Patient connected to  02 and soft FM   Post-op Assessment: Report given to PACU RN, Post -op Vital signs reviewed and stable and Patient moving all extremities  Post vital signs: Reviewed and stable  Complications: No apparent anesthesia complications

## 2019-12-01 NOTE — Anesthesia Postprocedure Evaluation (Signed)
Anesthesia Post Note  Patient: Crystal Abbott  Procedure(s) Performed: RIGHT URETEROSCOPY ,  RETROGRADE PYELOGRAM ,STENT REMOVAL , STONE MANIPULATION (Right Renal)     Patient location during evaluation: PACU Anesthesia Type: General Level of consciousness: awake and alert and oriented Pain management: pain level controlled Vital Signs Assessment: post-procedure vital signs reviewed and stable Respiratory status: spontaneous breathing, nonlabored ventilation and respiratory function stable Cardiovascular status: blood pressure returned to baseline Postop Assessment: no apparent nausea or vomiting Anesthetic complications: no   No complications documented.  Last Vitals:  Vitals:   12/01/19 1045 12/01/19 1055  BP: 124/77   Pulse: 72 80  Resp: 18 (!) 21  Temp:  36.9 C  SpO2: 95% 97%    Last Pain:  Vitals:   12/01/19 1055  TempSrc:   PainSc: 0-No pain                 Kaylyn Layer

## 2019-12-01 NOTE — Discharge Instructions (Signed)
°  Post Anesthesia Home Care Instructions  Activity: Get plenty of rest for the remainder of the day. A responsible adult should stay with you for 24 hours following the procedure.  For the next 24 hours, DO NOT: -Drive a car -Advertising copywriter -Drink alcoholic beverages -Take any medication unless instructed by your physician -Make any legal decisions or sign important papers.  Meals: Start with liquid foods such as gelatin or soup. Progress to regular foods as tolerated. Avoid greasy, spicy, heavy foods. If nausea and/or vomiting occur, drink only clear liquids until the nausea and/or vomiting subsides. Call your physician if vomiting continues.  Special Instructions/Symptoms: Your throat may feel dry or sore from the anesthesia or the breathing tube placed in your throat during surgery. If this causes discomfort, gargle with warm salt water. The discomfort should disappear within 24 hours.  If you had a scopolamine patch placed behind your ear for the management of post- operative nausea and/or vomiting:  1. The medication in the patch is effective for 72 hours, after which it should be removed.  Wrap patch in a tissue and discard in the trash. Wash hands thoroughly with soap and water. 2. You may remove the patch earlier than 72 hours if you experience unpleasant side effects which may include dry mouth, dizziness or visual disturbances. 3. Avoid touching the patch. Wash your hands with soap and water after contact with the patch.   DISCHARGE INSTRUCTIONS FOR KIDNEY STONE/URETERAL STENT   MEDICATIONS:  1.  Resume all your other meds from home - except do not take any extra narcotic pain meds that you may have at home.  2. AZO over the counter can be purchased and taken for burning with urination. 3. Tramadol which has already been prescribed is for moderate/severe pain, otherwise taking upto 1000 mg every 6 hours of plainTylenol will help treat your pain.     ACTIVITY:  1. No  strenuous activity x 1week  2. No driving while on narcotic pain medications  3. Drink plenty of water  4. Continue to walk at home - you can still get blood clots when you are at home, so keep active, but don't over do it.  5. May return to work/school tomorrow or when you feel ready   BATHING:  1. You can shower and we recommend daily showers   SIGNS/SYMPTOMS TO CALL:  Please call us if you have a fever greater than 101.5, uncontrolled nausea/vomiting, uncontrolled pain, dizziness, unable to urinate, bloody urine, chest pain, shortness of breath, leg swelling, leg pain, redness around wound, drainage from wound, or any other concerns or questions.   You can reach [place] at [phone].   FOLLOW-UP:  1. You have an appointment in 6 weeks with a ultrasound of your kidneys prior.

## 2019-12-04 ENCOUNTER — Encounter (HOSPITAL_BASED_OUTPATIENT_CLINIC_OR_DEPARTMENT_OTHER): Payer: Self-pay | Admitting: Urology

## 2020-04-06 HISTORY — PX: GASTRIC BYPASS: SHX52

## 2020-11-28 ENCOUNTER — Other Ambulatory Visit: Payer: Self-pay

## 2020-11-28 ENCOUNTER — Encounter: Payer: 59 | Attending: General Surgery | Admitting: Skilled Nursing Facility1

## 2020-11-28 ENCOUNTER — Encounter: Payer: Self-pay | Admitting: Skilled Nursing Facility1

## 2020-11-28 DIAGNOSIS — E669 Obesity, unspecified: Secondary | ICD-10-CM | POA: Insufficient documentation

## 2020-11-28 NOTE — Progress Notes (Signed)
Nutrition Assessment for Bariatric Surgery Medical Nutrition Therapy Appt Start Time: 10:35    End Time: 11:30  Patient was seen on 11/28/2020 for Pre-Operative Nutrition Assessment. Letter of approval faxed to [facility] bariatric surgery program coordinator on 11/28/2020.   Referral stated Supervised Weight Loss (SWL) visits needed: 0  Pt completed visits.   Pt has cleared nutrition requirements.   Planned surgery: RYGB Pt expectation of surgery: to be healthy Pt expectation of dietitian: none identified     NUTRITION ASSESSMENT   Anthropometrics  Start weight at [facility]: 278 lbs (date: 11/28/2020)  Height: 64 in BMI: 47.72 kg/m2     Clinical  Medical hx: endometriosis, kidney stone Medications: N/A  Labs: All WNL except for vitamin D 26.1, B12  Notable signs/symptoms: N/A Any previous deficiencies? No  Micronutrient Nutrition Focused Physical Exam: Hair: No issues observed Eyes: No issues observed Mouth: No issues observed Neck: No issues observed Nails: No issues observed Skin: No issues observed  Lifestyle & Dietary Hx  Pt states she does a 1 mile walk 4 days a week tries to increase her pace.  Pt states she believes her weight gain was due to moving to a new town and having her children back to back and not working so the stress of that lead to emotional eating.  Pt states she feels her emotional eating is more under control and sees where cutting foods out completely will lead to overeating.   24-Hr Dietary Recall First Meal: oatmeal or granola bar Snack: pork rinds Second Meal: salad or sandwich Snack: nut mix or granola Third Meal: roast + potato + carrots + peas + mushroom Snack:  Beverages: coffee + flavorings with sugar, water, water + flavorings    Estimated Energy Needs Calories: 1500   NUTRITION DIAGNOSIS  Overweight/obesity (Ivins-3.3) related to past poor dietary habits and physical inactivity as evidenced by patient w/ planned RYGB  surgery following dietary guidelines for continued weight loss.    NUTRITION INTERVENTION  Nutrition counseling (C-1) and education (E-2) to facilitate bariatric surgery goals.  Educated pt on micronutrient deficiencies post surgery and strategies to mitigate that risk   Pre-Op Goals Reviewed with the Patient Track food and beverage intake (pen and paper, MyFitness Pal, Baritastic app, etc.) Make healthy food choices while monitoring portion sizes Consume 3 meals per day or try to eat every 3-5 hours Avoid concentrated sugars and fried foods Keep sugar & fat in the single digits per serving on food labels Practice CHEWING your food (aim for applesauce consistency) Practice not drinking 15 minutes before, during, and 30 minutes after each meal and snack Avoid all carbonated beverages (ex: soda, sparkling beverages)  Limit caffeinated beverages (ex: coffee, tea, energy drinks) Avoid all sugar-sweetened beverages (ex: regular soda, sports drinks)  Avoid alcohol  Aim for 64-100 ounces of FLUID daily (with at least half of fluid intake being plain water)  Aim for at least 60-80 grams of PROTEIN daily Look for a liquid protein source that contains ?15 g protein and ?5 g carbohydrate (ex: shakes, drinks, shots) Make a list of non-food related activities Physical activity is an important part of a healthy lifestyle so keep it moving! The goal is to reach 150 minutes of exercise per week, including cardiovascular and weight baring activity.  *Goals that are bolded indicate the pt would like to start working towards these  Handouts Provided Include  Bariatric Surgery handouts (Nutrition Visits, Pre-Op Goals, Protein Shakes, Vitamins & Minerals)  Learning Style &  Readiness for Change Teaching method utilized: Visual & Auditory  Demonstrated degree of understanding via: Teach Back  Readiness Level: Action Barriers to learning/adherence to lifestyle change: non identified      MONITORING &  EVALUATION Dietary intake, weekly physical activity, body weight, and pre-op goals reached at next nutrition visit.    Next Steps  Patient is to follow up at [facility] for Pre-Op Class >2 weeks before surgery for further nutrition education.  Pt has completed visits. No further supervised visits required/recomended

## 2021-01-20 ENCOUNTER — Ambulatory Visit: Payer: 59

## 2021-01-27 ENCOUNTER — Other Ambulatory Visit: Payer: Self-pay

## 2021-01-27 ENCOUNTER — Encounter: Payer: BC Managed Care – PPO | Attending: General Surgery | Admitting: Skilled Nursing Facility1

## 2021-01-27 DIAGNOSIS — E669 Obesity, unspecified: Secondary | ICD-10-CM | POA: Diagnosis not present

## 2021-01-27 NOTE — Progress Notes (Signed)
Pre-Operative Nutrition Class:    Patient was seen on 01/27/2021 for Pre-Operative Bariatric Surgery Education at [facility]    Surgery date: 02/18/2021 Surgery type: RYGB Start weight at [facility]: 278 Weight today: 276  Samples given per MNT protocol. Patient educated on appropriate usage:  Ensure max exp: June 04, 2021 Ensure max lot: [id number]  Chewable bariatric advantage: advanced multi EA exp: 08/23 Chewable bariatric advantage: advanced multi EA lot: [id number]  Bariatric advantage calcium citrate exp: 02/23 Bariatric advantage calcium citrate lot: [id number]   The following the learning objectives were met by the patient during this course: Identify Pre-Op Dietary Goals and will begin 2 weeks pre-operatively Identify appropriate sources of fluids and proteins  State protein recommendations and appropriate sources pre and post-operatively Identify Post-Operative Dietary Goals and will follow for 2 weeks post-operatively Identify appropriate multivitamin and calcium sources Describe the need for physical activity post-operatively and will follow MD recommendations State when to call healthcare provider regarding medication questions or post-operative complications When having a diagnosis of diabetes understanding hypoglycemia symptoms and the inclusion of 1 complex carbohydrate per meal  Handouts given during class include: Pre-Op Bariatric Surgery Diet Handout Protein Shake Handout Post-Op Bariatric Surgery Nutrition Handout BELT Program Information Flyer Support Group Information Flyer [facility] Bariatric Supplements Price List  Follow-Up Plan: Patient will follow-up at [facility] 2 weeks post operatively for diet advancement per MD.  

## 2021-01-28 NOTE — Progress Notes (Signed)
Sent message, via epic in basket, requesting orders in epic from surgeon.  

## 2021-01-31 ENCOUNTER — Ambulatory Visit: Payer: Self-pay | Admitting: General Surgery

## 2021-02-05 DIAGNOSIS — Z23 Encounter for immunization: Secondary | ICD-10-CM | POA: Diagnosis not present

## 2021-02-05 NOTE — Patient Instructions (Addendum)
DUE TO COVID-19 ONLY ONE VISITOR IS ALLOWED TO COME WITH YOU AND STAY IN THE WAITING ROOM ONLY DURING PRE OP AND PROCEDURE.   **NO VISITORS ARE ALLOWED IN THE SHORT STAY AREA OR RECOVERY ROOM!!**  IF YOU WILL BE ADMITTED INTO THE HOSPITAL YOU ARE ALLOWED ONLY TWO SUPPORT PEOPLE DURING VISITATION HOURS ONLY (10AM -8PM)   The support person(s) may change daily. The support person(s) must pass our screening, gel in and out, and wear a mask at all times, including in the patient's room. Patients must also wear a mask when staff or their support person are in the room.  No visitors under the age of 1. Any visitor under the age of 45 must be accompanied by an adult.    COVID SWAB TESTING MUST BE COMPLETED ON:  02/14/21 **MUST PRESENT COMPLETED FORM AT TESTING SITE**    [address]  [place] (backside of the building) Open 8am-3pm. No appointment needed. You are not required to quarantine, however you are required to wear a well-fitted mask when you are out and around people not in your household.  Hand Hygiene often Do NOT share personal items Notify your provider if you are in close contact with someone who has COVID or you develop fever 100.4 or greater, new onset of sneezing, cough, sore throat, shortness of breath or body aches.       Your procedure is scheduled on: 02/18/21   Report to [facility] Main Entrance    Report to admitting at 7:45 AM   Call this number if you have problems the morning of surgery [phone]  NO SOLID FOOD AFTER 6PM DAY BEFORE SURGERY. YOU MAY HAVE CLEAR LIQUIDS UNTIL 7AM MORNING OF SURGERY.  PAIN IS EXPECTED AFTER SURGERY AND WILL NOT BE COMPLETELY ELIMINATED. AMBULATION AND TYLENOL WILL HELP REDUCE INCISIONAL AND GAS PAIN. MOVEMENT IS KEY!  YOU ARE EXPECTED TO BE OUT OF BED WITHIN 4 HOURS OF ADMISSION TO YOUR PATIENT ROOM.  SITTING IN THE RECLINER THROUGHOUT THE DAY IS IMPORTANT FOR DRINKING FLUIDS AND MOVING GAS THROUGHOUT THE GI  TRACT.  COMPRESSION STOCKINGS SHOULD BE WORN [facility] STAY UNLESS YOU ARE WALKING.   INCENTIVE SPIROMETER SHOULD BE USED EVERY HOUR WHILE AWAKE TO DECREASE POST-OPERATIVE COMPLICATIONS SUCH AS PNEUMONIA.  WHEN DISCHARGED HOME, IT IS IMPORTANT TO CONTINUE TO WALK EVERY HOUR AND USE THE INCENTIVE SPIROMETER EVERY HOUR  CLEAR LIQUID DIET  Foods Allowed                                                                     Foods Excluded  Water, Black Coffee and tea (no milk or creamer)            liquids that you cannot  Plain Jell-O in any flavor  (No red)                                     see through such as: Fruit ices (not with fruit pulp)  milk, soups, orange juice              Iced Popsicles (No red)                                                 All solid food                                   Apple juices Sports drinks like Gatorade (No red) Lightly seasoned clear broth or consume(fat free) Sugar  Sample Menu Breakfast                                Lunch                                     Supper Cranberry juice                    Beef broth                            Chicken broth Jell-O                                     Grape juice                           Apple juice Coffee or tea                        Jell-O                                      Popsicle                                                Coffee or tea                        Coffee or tea       The day of surgery:  Drink ONE (1) Pre-Surgery G2 by 7:00 am the morning of surgery. Drink in one sitting. Do not sip.  This drink was given to you during your hospital  pre-op appointment visit. Nothing else to drink after completing the  Pre-Surgery G2.          If you have questions, please contact your surgeon's office.     Oral Hygiene is also important to reduce your risk of infection.                                    Remember - BRUSH YOUR TEETH  THE MORNING OF SURGERY WITH YOUR REGULAR TOOTHPASTE   Take these medicines the morning of surgery  with A SIP OF WATER: None                              You may not have any metal on your body including hair pins, jewelry, and body piercing             Do not wear make-up, lotions, powders, perfumes, or deodorant  Do not wear nail polish including gel and S&S, artificial/acrylic nails, or any other type of covering on natural nails including finger and toenails. If you have artificial nails, gel coating, etc. that needs to be removed by a nail salon please have this removed prior to surgery or surgery may need to be canceled/ delayed if the surgeon/ anesthesia feels like they are unable to be safely monitored.   Do not shave  48 hours prior to surgery.    Do not bring valuables to the hospital. [facility] IS NOT             RESPONSIBLE   FOR VALUABLES.   Contacts, dentures or bridgework may not be worn into surgery.   Bring small overnight bag day of surgery.              Please read over the following fact sheets you were given: IF YOU HAVE QUESTIONS ABOUT YOUR PRE-OP INSTRUCTIONS PLEASE CALL [phone]- [facility] - Preparing for Surgery Before surgery, you can play an important role.  Because skin is not sterile, your skin needs to be as free of germs as possible.  You can reduce the number of germs on your skin by washing with CHG (chlorahexidine gluconate) soap before surgery.  CHG is an antiseptic cleaner which kills germs and bonds with the skin to continue killing germs even after washing. Please DO NOT use if you have an allergy to CHG or antibacterial soaps.  If your skin becomes reddened/irritated stop using the CHG and inform your nurse when you arrive at Short Stay. Do not shave (including legs and underarms) for at least 48 hours prior to the first CHG shower.  You may shave your face/neck.  Please follow these instructions carefully:  1.  Shower with CHG Soap  the night before surgery and the  morning of surgery.  2.  If you choose to wash your hair, wash your hair first as usual with your normal  shampoo.  3.  After you shampoo, rinse your hair and body thoroughly to remove the shampoo.                             4.  Use CHG as you would any other liquid soap.  You can apply chg directly to the skin and wash.  Gently with a scrungie or clean washcloth.  5.  Apply the CHG Soap to your body ONLY FROM THE NECK DOWN.   Do   not use on face/ open                           Wound or open sores. Avoid contact with eyes, ears mouth and   genitals (private parts).                       Wash face,  Genitals (private parts) with your normal soap.  6.  Wash thoroughly, paying special attention to the area where your    surgery  will be performed.  7.  Thoroughly rinse your body with warm water from the neck down.  8.  DO NOT shower/wash with your normal soap after using and rinsing off the CHG Soap.                9.  Pat yourself dry with a clean towel.            10.  Wear clean pajamas.            11.  Place clean sheets on your bed the night of your first shower and do not  sleep with pets. Day of Surgery : Do not apply any lotions/deodorants the morning of surgery.  Please wear clean clothes to the hospital/surgery center.  FAILURE TO FOLLOW THESE INSTRUCTIONS MAY RESULT IN THE CANCELLATION OF YOUR SURGERY  PATIENT SIGNATURE_________________________________  NURSE SIGNATURE__________________________________  ________________________________________________________________________

## 2021-02-05 NOTE — Progress Notes (Addendum)
COVID swab appointment: 02/14/21  COVID Vaccine Completed: no Date COVID Vaccine completed: Has received booster: COVID vaccine manufacturer: [employer]   Date of COVID positive in last 90 days: no  PCP - Adela Glimpse, NP Cardiologist - n/a  Chest x-ray - 02/06/21 Epic EKG - 02/06/21 Epic Stress Test - n/a ECHO - n/a Cardiac Cath - n/a Pacemaker/ICD device last checked: n/a Spinal Cord Stimulator: n/a  Sleep Study - n/a CPAP -   Fasting Blood Sugar - n/a Checks Blood Sugar _____ times a day  Blood Thinner Instructions: n/a Aspirin Instructions: Last Dose:  Activity level: Can go up a flight of stairs and perform activities of daily living without stopping and without symptoms of chest pain or shortness of breath.       Anesthesia review: woke up during wisdom teeth removal  Patient denies shortness of breath, fever, cough and chest pain at PAT appointment   Patient verbalized understanding of instructions that were given to them at the PAT appointment. Patient was also instructed that they will need to review over the PAT instructions again at home before surgery.

## 2021-02-06 ENCOUNTER — Other Ambulatory Visit: Payer: Self-pay

## 2021-02-06 ENCOUNTER — Ambulatory Visit (HOSPITAL_COMMUNITY)
Admission: RE | Admit: 2021-02-06 | Discharge: 2021-02-06 | Disposition: A | Discharge status: Home / Self Care | Payer: BC Managed Care – PPO | Source: Ambulatory Visit | Attending: Anesthesiology | Admitting: Anesthesiology

## 2021-02-06 ENCOUNTER — Encounter (HOSPITAL_COMMUNITY)
Admission: RE | Admit: 2021-02-06 | Discharge: 2021-02-06 | Disposition: A | Discharge status: Home / Self Care | Payer: BC Managed Care – PPO | Source: Ambulatory Visit | Attending: General Surgery | Admitting: General Surgery

## 2021-02-06 ENCOUNTER — Encounter (HOSPITAL_COMMUNITY): Payer: Self-pay

## 2021-02-06 DIAGNOSIS — Z01818 Encounter for other preprocedural examination: Secondary | ICD-10-CM | POA: Insufficient documentation

## 2021-02-06 DIAGNOSIS — E782 Mixed hyperlipidemia: Secondary | ICD-10-CM | POA: Diagnosis not present

## 2021-02-06 LAB — CBC WITH DIFFERENTIAL/PLATELET
Abs Immature Granulocytes: 0.02 10*3/uL (ref 0.00–0.07)
Basophils Absolute: 0 10*3/uL (ref 0.0–0.1)
Basophils Relative: 0 %
Eosinophils Absolute: 0.1 10*3/uL (ref 0.0–0.5)
Eosinophils Relative: 1 %
HCT: 41.6 % (ref 36.0–46.0)
Hemoglobin: 13.6 g/dL (ref 12.0–15.0)
Immature Granulocytes: 0 %
Lymphocytes Relative: 29 %
Lymphs Abs: 2.5 10*3/uL (ref 0.7–4.0)
MCH: 28.8 pg (ref 26.0–34.0)
MCHC: 32.7 g/dL (ref 30.0–36.0)
MCV: 88.1 fL (ref 80.0–100.0)
Monocytes Absolute: 0.6 10*3/uL (ref 0.1–1.0)
Monocytes Relative: 6 %
Neutro Abs: 5.6 10*3/uL (ref 1.7–7.7)
Neutrophils Relative %: 64 %
Platelets: 247 10*3/uL (ref 150–400)
RBC: 4.72 MIL/uL (ref 3.87–5.11)
RDW: 12.8 % (ref 11.5–15.5)
WBC: 8.8 10*3/uL (ref 4.0–10.5)
nRBC: 0 % (ref 0.0–0.2)

## 2021-02-06 LAB — COMPREHENSIVE METABOLIC PANEL
ALT: 17 U/L (ref 0–44)
AST: 17 U/L (ref 15–41)
Albumin: 4.3 g/dL (ref 3.5–5.0)
Alkaline Phosphatase: 60 U/L (ref 38–126)
Anion gap: 9 (ref 5–15)
BUN: 13 mg/dL (ref 6–20)
CO2: 23 mmol/L (ref 22–32)
Calcium: 9.2 mg/dL (ref 8.9–10.3)
Chloride: 105 mmol/L (ref 98–111)
Creatinine, Ser: 0.47 mg/dL (ref 0.44–1.00)
GFR, Estimated: >60 mL/min (ref >60)
Glucose, Bld: 83 mg/dL (ref 70–99)
Potassium: 3.5 mmol/L (ref 3.5–5.1)
Sodium: 137 mmol/L (ref 135–145)
Total Bilirubin: 0.7 mg/dL (ref 0.3–1.2)
Total Protein: 8 g/dL (ref 6.5–8.1)

## 2021-02-06 LAB — TYPE AND SCREEN
ABO/RH(D): B POS
Antibody Screen: NEGATIVE

## 2021-02-14 ENCOUNTER — Other Ambulatory Visit: Payer: Self-pay | Admitting: General Surgery

## 2021-02-14 LAB — SARS CORONAVIRUS 2 (TAT 6-24 HRS): SARS Coronavirus 2: NEGATIVE

## 2021-02-18 ENCOUNTER — Encounter (HOSPITAL_COMMUNITY): Payer: Self-pay | Admitting: General Surgery

## 2021-02-18 ENCOUNTER — Inpatient Hospital Stay (HOSPITAL_COMMUNITY): Payer: BC Managed Care – PPO | Admitting: Certified Registered Nurse Anesthetist

## 2021-02-18 ENCOUNTER — Inpatient Hospital Stay (HOSPITAL_COMMUNITY)
Admission: RE | Admit: 2021-02-18 | Discharge: 2021-02-19 | DRG: 621 | Disposition: A | Discharge status: Home / Self Care | Payer: BC Managed Care – PPO | Source: Ambulatory Visit | Attending: General Surgery | Admitting: General Surgery

## 2021-02-18 ENCOUNTER — Encounter (HOSPITAL_COMMUNITY)
Admission: RE | Disposition: A | Discharge status: Home / Self Care | Payer: Self-pay | Source: Ambulatory Visit | Attending: General Surgery

## 2021-02-18 ENCOUNTER — Other Ambulatory Visit: Payer: Self-pay

## 2021-02-18 DIAGNOSIS — Z8249 Family history of ischemic heart disease and other diseases of the circulatory system: Secondary | ICD-10-CM

## 2021-02-18 DIAGNOSIS — R3915 Urgency of urination: Secondary | ICD-10-CM | POA: Diagnosis not present

## 2021-02-18 DIAGNOSIS — Z6841 Body Mass Index (BMI) 40.0 and over, adult: Secondary | ICD-10-CM

## 2021-02-18 DIAGNOSIS — Z975 Presence of (intrauterine) contraceptive device: Secondary | ICD-10-CM

## 2021-02-18 DIAGNOSIS — E782 Mixed hyperlipidemia: Secondary | ICD-10-CM | POA: Diagnosis present

## 2021-02-18 DIAGNOSIS — Z880 Allergy status to penicillin: Secondary | ICD-10-CM | POA: Diagnosis not present

## 2021-02-18 DIAGNOSIS — Z8742 Personal history of other diseases of the female genital tract: Secondary | ICD-10-CM | POA: Diagnosis not present

## 2021-02-18 HISTORY — PX: GASTRIC ROUX-EN-Y: SHX5262

## 2021-02-18 LAB — PREGNANCY, URINE: Preg Test, Ur: NEGATIVE

## 2021-02-18 LAB — HEMOGLOBIN AND HEMATOCRIT, BLOOD
HCT: 39.5 % (ref 36.0–46.0)
Hemoglobin: 13.5 g/dL (ref 12.0–15.0)

## 2021-02-18 SURGERY — LAPAROSCOPIC ROUX-EN-Y GASTRIC BYPASS WITH UPPER ENDOSCOPY
Anesthesia: General

## 2021-02-18 MED ORDER — HYDRALAZINE HCL 25 MG PO TABS: 25.0000 mg | ORAL_TABLET | Freq: Three times a day (TID) | ORAL | Status: DC | PRN

## 2021-02-18 MED ORDER — SUGAMMADEX SODIUM 200 MG/2ML IV SOLN
INTRAVENOUS | Status: DC | PRN
Start: 2021-02-18 — End: 2021-02-18
  Administered 2021-02-18: 300 mg via INTRAVENOUS

## 2021-02-18 MED ORDER — LACTATED RINGERS IV SOLN: INTRAVENOUS | Status: DC

## 2021-02-18 MED ORDER — HYDROMORPHONE HCL 1 MG/ML IJ SOLN
INTRAMUSCULAR | Status: AC
  Administered 2021-02-18: 0.5 mg via INTRAVENOUS
  Filled 2021-02-18: qty 2

## 2021-02-18 MED ORDER — SCOPOLAMINE 1 MG/3DAYS TD PT72
1.0000 | MEDICATED_PATCH | TRANSDERMAL | Status: DC
  Administered 2021-02-18: 1.5 mg via TRANSDERMAL
  Filled 2021-02-18: qty 1

## 2021-02-18 MED ORDER — DEXAMETHASONE SODIUM PHOSPHATE 4 MG/ML IJ SOLN
4.0000 mg | INTRAMUSCULAR | Status: AC
  Administered 2021-02-18: 5 mg via INTRAVENOUS

## 2021-02-18 MED ORDER — ENSURE MAX PROTEIN PO LIQD
2.0000 [oz_av] | ORAL | Status: DC
  Administered 2021-02-19 (×4): 2 [oz_av] via ORAL

## 2021-02-18 MED ORDER — ROCURONIUM BROMIDE 10 MG/ML (PF) SYRINGE
PREFILLED_SYRINGE | INTRAVENOUS | Status: AC
  Filled 2021-02-18: qty 10

## 2021-02-18 MED ORDER — APREPITANT 40 MG PO CAPS
40.0000 mg | ORAL_CAPSULE | ORAL | Status: AC
  Administered 2021-02-18: 40 mg via ORAL
  Filled 2021-02-18: qty 1

## 2021-02-18 MED ORDER — ACETAMINOPHEN 500 MG PO TABS
1000.0000 mg | ORAL_TABLET | Freq: Three times a day (TID) | ORAL | Status: DC
  Administered 2021-02-18 – 2021-02-19 (×3): 1000 mg via ORAL
  Filled 2021-02-18 (×3): qty 2

## 2021-02-18 MED ORDER — HYDROMORPHONE HCL 1 MG/ML IJ SOLN
0.2500 mg | INTRAMUSCULAR | Status: DC | PRN
  Administered 2021-02-18: 0.5 mg via INTRAVENOUS

## 2021-02-18 MED ORDER — DIPHENHYDRAMINE HCL 50 MG/ML IJ SOLN
INTRAMUSCULAR | Status: DC | PRN
  Administered 2021-02-18: 12.5 mg via INTRAVENOUS

## 2021-02-18 MED ORDER — OXYCODONE HCL 5 MG/5ML PO SOLN
5.0000 mg | Freq: Four times a day (QID) | ORAL | Status: DC | PRN
  Administered 2021-02-18: 5 mg via ORAL
  Filled 2021-02-18: qty 5

## 2021-02-18 MED ORDER — ONDANSETRON HCL 4 MG/2ML IJ SOLN
INTRAMUSCULAR | Status: DC | PRN
  Administered 2021-02-18: 4 mg via INTRAVENOUS

## 2021-02-18 MED ORDER — PROPOFOL 10 MG/ML IV BOLUS
INTRAVENOUS | Status: DC | PRN
  Administered 2021-02-18: 200 mg via INTRAVENOUS

## 2021-02-18 MED ORDER — BUPIVACAINE LIPOSOME 1.3 % IJ SUSP
INTRAMUSCULAR | Status: DC | PRN
  Administered 2021-02-18: 20 mL

## 2021-02-18 MED ORDER — ONDANSETRON HCL 4 MG/2ML IJ SOLN
INTRAMUSCULAR | Status: AC
  Filled 2021-02-18: qty 2

## 2021-02-18 MED ORDER — MIDAZOLAM HCL 2 MG/2ML IJ SOLN
INTRAMUSCULAR | Status: AC
  Filled 2021-02-18: qty 2

## 2021-02-18 MED ORDER — LIDOCAINE 2% (20 MG/ML) 5 ML SYRINGE
INTRAMUSCULAR | Status: DC | PRN
Start: 2021-02-18 — End: 2021-02-18
  Administered 2021-02-18: 100 mg via INTRAVENOUS

## 2021-02-18 MED ORDER — PHENYLEPHRINE 40 MCG/ML (10ML) SYRINGE FOR IV PUSH (FOR BLOOD PRESSURE SUPPORT)
PREFILLED_SYRINGE | INTRAVENOUS | Status: DC | PRN
  Administered 2021-02-18: 120 ug via INTRAVENOUS

## 2021-02-18 MED ORDER — 0.9 % SODIUM CHLORIDE (POUR BTL) OPTIME
TOPICAL | Status: DC | PRN
  Administered 2021-02-18: 1000 mL

## 2021-02-18 MED ORDER — BUPIVACAINE HCL 0.25 % IJ SOLN
INTRAMUSCULAR | Status: AC
  Filled 2021-02-18: qty 1

## 2021-02-18 MED ORDER — MEPERIDINE HCL 50 MG/ML IJ SOLN: 6.2500 mg | INTRAMUSCULAR | Status: DC | PRN

## 2021-02-18 MED ORDER — DEXTROSE-NACL 5-0.45 % IV SOLN: INTRAVENOUS | Status: DC

## 2021-02-18 MED ORDER — MORPHINE SULFATE (PF) 2 MG/ML IV SOLN: 1.0000 mg | INTRAVENOUS | Status: DC | PRN

## 2021-02-18 MED ORDER — DEXMEDETOMIDINE (PRECEDEX) IN NS 20 MCG/5ML (4 MCG/ML) IV SYRINGE
PREFILLED_SYRINGE | INTRAVENOUS | Status: AC
  Filled 2021-02-18: qty 5

## 2021-02-18 MED ORDER — CHLORHEXIDINE GLUCONATE 0.12 % MT SOLN: 15.0000 mL | Freq: Once | OROMUCOSAL | Status: DC

## 2021-02-18 MED ORDER — KETAMINE HCL 10 MG/ML IJ SOLN
INTRAMUSCULAR | Status: AC
  Filled 2021-02-18: qty 1

## 2021-02-18 MED ORDER — CHLORHEXIDINE GLUCONATE 0.12 % MT SOLN
15.0000 mL | Freq: Once | OROMUCOSAL | Status: AC
  Administered 2021-02-18: 15 mL via OROMUCOSAL

## 2021-02-18 MED ORDER — LIDOCAINE HCL (PF) 2 % IJ SOLN
INTRAMUSCULAR | Status: DC | PRN
  Administered 2021-02-18: 1.5 mg/kg/h via INTRADERMAL

## 2021-02-18 MED ORDER — MIDAZOLAM HCL 5 MG/5ML IJ SOLN
INTRAMUSCULAR | Status: DC | PRN
  Administered 2021-02-18: 2 mg via INTRAVENOUS

## 2021-02-18 MED ORDER — DIPHENHYDRAMINE HCL 50 MG/ML IJ SOLN
INTRAMUSCULAR | Status: AC
  Filled 2021-02-18: qty 1

## 2021-02-18 MED ORDER — ENOXAPARIN SODIUM 30 MG/0.3ML IJ SOSY
30.0000 mg | PREFILLED_SYRINGE | Freq: Two times a day (BID) | INTRAMUSCULAR | Status: DC
  Administered 2021-02-18 – 2021-02-19 (×2): 30 mg via SUBCUTANEOUS
  Filled 2021-02-18 (×2): qty 0.3

## 2021-02-18 MED ORDER — CHLORHEXIDINE GLUCONATE CLOTH 2 % EX PADS: 6.0000 | MEDICATED_PAD | Freq: Once | CUTANEOUS | Status: DC

## 2021-02-18 MED ORDER — LIDOCAINE HCL (PF) 2 % IJ SOLN
INTRAMUSCULAR | Status: AC
  Filled 2021-02-18: qty 15

## 2021-02-18 MED ORDER — DEXAMETHASONE SODIUM PHOSPHATE 10 MG/ML IJ SOLN
INTRAMUSCULAR | Status: AC
  Filled 2021-02-18: qty 1

## 2021-02-18 MED ORDER — ACETAMINOPHEN 500 MG PO TABS
1000.0000 mg | ORAL_TABLET | ORAL | Status: AC
  Administered 2021-02-18: 1000 mg via ORAL
  Filled 2021-02-18: qty 2

## 2021-02-18 MED ORDER — LACTATED RINGERS IR SOLN
Status: DC | PRN
  Administered 2021-02-18: 1

## 2021-02-18 MED ORDER — FAMOTIDINE IN NACL 20-0.9 MG/50ML-% IV SOLN
20.0000 mg | Freq: Two times a day (BID) | INTRAVENOUS | Status: DC
  Administered 2021-02-18 (×2): 20 mg via INTRAVENOUS
  Filled 2021-02-18 (×2): qty 50

## 2021-02-18 MED ORDER — FENTANYL CITRATE (PF) 250 MCG/5ML IJ SOLN
INTRAMUSCULAR | Status: AC
  Filled 2021-02-18: qty 5

## 2021-02-18 MED ORDER — PHENYLEPHRINE 40 MCG/ML (10ML) SYRINGE FOR IV PUSH (FOR BLOOD PRESSURE SUPPORT)
PREFILLED_SYRINGE | INTRAVENOUS | Status: AC
  Filled 2021-02-18: qty 10

## 2021-02-18 MED ORDER — KETAMINE HCL 10 MG/ML IJ SOLN
INTRAMUSCULAR | Status: DC | PRN
  Administered 2021-02-18: 30 mg via INTRAVENOUS

## 2021-02-18 MED ORDER — LIDOCAINE HCL (PF) 2 % IJ SOLN
INTRAMUSCULAR | Status: AC
  Filled 2021-02-18: qty 5

## 2021-02-18 MED ORDER — ROCURONIUM BROMIDE 10 MG/ML (PF) SYRINGE
PREFILLED_SYRINGE | INTRAVENOUS | Status: DC | PRN
  Administered 2021-02-18: 40 mg via INTRAVENOUS
  Administered 2021-02-18: 20 mg via INTRAVENOUS
  Administered 2021-02-18: 60 mg via INTRAVENOUS

## 2021-02-18 MED ORDER — BUPIVACAINE HCL 0.25 % IJ SOLN
INTRAMUSCULAR | Status: DC | PRN
  Administered 2021-02-18: 30 mL

## 2021-02-18 MED ORDER — HEPARIN SODIUM (PORCINE) 5000 UNIT/ML IJ SOLN
5000.0000 [IU] | INTRAMUSCULAR | Status: AC
  Administered 2021-02-18: 5000 [IU] via SUBCUTANEOUS
  Filled 2021-02-18: qty 1

## 2021-02-18 MED ORDER — PROMETHAZINE HCL 25 MG/ML IJ SOLN: 6.2500 mg | INTRAMUSCULAR | Status: DC | PRN

## 2021-02-18 MED ORDER — BUPIVACAINE LIPOSOME 1.3 % IJ SUSP: 20.0000 mL | Freq: Once | INTRAMUSCULAR | Status: DC

## 2021-02-18 MED ORDER — ACETAMINOPHEN 160 MG/5ML PO SOLN: 1000.0000 mg | Freq: Three times a day (TID) | ORAL | Status: DC

## 2021-02-18 MED ORDER — FENTANYL CITRATE (PF) 100 MCG/2ML IJ SOLN
INTRAMUSCULAR | Status: DC | PRN
  Administered 2021-02-18: 50 ug via INTRAVENOUS
  Administered 2021-02-18 (×2): 100 ug via INTRAVENOUS

## 2021-02-18 MED ORDER — SIMETHICONE 80 MG PO CHEW: 80.0000 mg | CHEWABLE_TABLET | Freq: Four times a day (QID) | ORAL | Status: DC | PRN

## 2021-02-18 MED ORDER — BUPIVACAINE LIPOSOME 1.3 % IJ SUSP
INTRAMUSCULAR | Status: AC
  Filled 2021-02-18: qty 20

## 2021-02-18 MED ORDER — ONDANSETRON HCL 4 MG/2ML IJ SOLN
4.0000 mg | INTRAMUSCULAR | Status: DC | PRN
  Administered 2021-02-18: 4 mg via INTRAVENOUS
  Filled 2021-02-18: qty 2

## 2021-02-18 MED ORDER — ORAL CARE MOUTH RINSE: 15.0000 mL | Freq: Once | OROMUCOSAL | Status: AC

## 2021-02-18 MED ORDER — ORAL CARE MOUTH RINSE: 15.0000 mL | Freq: Once | OROMUCOSAL | Status: DC

## 2021-02-18 MED ORDER — SODIUM CHLORIDE 0.9 % IV SOLN
2.0000 g | INTRAVENOUS | Status: AC
  Administered 2021-02-18: 2 g via INTRAVENOUS
  Filled 2021-02-18: qty 2

## 2021-02-18 SURGICAL SUPPLY — 78 items
APPLIER CLIP 5 13 M/L LIGAMAX5 (MISCELLANEOUS)
APPLIER CLIP ROT 10 11.4 M/L (STAPLE)
APPLIER CLIP ROT 13.4 12 LRG (CLIP)
BAG COUNTER SPONGE SURGICOUNT (BAG) ×2 IMPLANT
BAG SURGICOUNT SPONGE COUNTING (BAG) ×1
BENZOIN TINCTURE PRP APPL 2/3 (GAUZE/BANDAGES/DRESSINGS) IMPLANT
BLADE SURG SZ11 CARB STEEL (BLADE) ×3 IMPLANT
BNDG ADH 1X3 SHEER STRL LF (GAUZE/BANDAGES/DRESSINGS) ×3 IMPLANT
BUTTON OLYMPUS DEFENDO 5 PIECE (MISCELLANEOUS) ×3 IMPLANT
CABLE HIGH FREQUENCY MONO STRZ (ELECTRODE) IMPLANT
CHLORAPREP W/TINT 26 (MISCELLANEOUS) ×3 IMPLANT
CLIP APPLIE 5 13 M/L LIGAMAX5 (MISCELLANEOUS) IMPLANT
CLIP APPLIE ROT 10 11.4 M/L (STAPLE) IMPLANT
CLIP APPLIE ROT 13.4 12 LRG (CLIP) IMPLANT
CLOSURE WOUND 1/2 X4 (GAUZE/BANDAGES/DRESSINGS) ×1
COVER SURGICAL LIGHT HANDLE (MISCELLANEOUS) ×3 IMPLANT
DEVICE SUTURE ENDOST 10MM (ENDOMECHANICALS) ×3 IMPLANT
DRAIN CHANNEL 19F RND (DRAIN) IMPLANT
DRAIN PENROSE 0.25X18 (DRAIN) ×3 IMPLANT
ELECT L-HOOK LAP 45CM DISP (ELECTROSURGICAL) ×3
ELECTRODE L-HOOK LAP 45CM DISP (ELECTROSURGICAL) ×1 IMPLANT
EVACUATOR SILICONE 100CC (DRAIN) IMPLANT
GAUZE 4X4 16PLY ~~LOC~~+RFID DBL (SPONGE) ×3 IMPLANT
GAUZE SPONGE 4X4 12PLY STRL (GAUZE/BANDAGES/DRESSINGS) IMPLANT
GLOVE SRG 8 PF TXTR STRL LF DI (GLOVE) ×1 IMPLANT
GLOVE SURG ENC MOIS LTX SZ7 (GLOVE) ×3 IMPLANT
GLOVE SURG ENC MOIS LTX SZ8 (GLOVE) ×3 IMPLANT
GLOVE SURG POLY ORTHO LF SZ7.5 (GLOVE) ×3 IMPLANT
GLOVE SURG POLYISO LF SZ6.5 (GLOVE) ×3 IMPLANT
GLOVE SURG POLYISO LF SZ7 (GLOVE) ×3 IMPLANT
GLOVE SURG UNDER POLY LF SZ6.5 (GLOVE) ×3 IMPLANT
GLOVE SURG UNDER POLY LF SZ7 (GLOVE) ×6 IMPLANT
GLOVE SURG UNDER POLY LF SZ8 (GLOVE) ×2
GOWN STRL REUS W/TWL LRG LVL3 (GOWN DISPOSABLE) ×9 IMPLANT
GOWN STRL REUS W/TWL XL LVL3 (GOWN DISPOSABLE) ×3 IMPLANT
GRASPER SUT TROCAR 14GX15 (MISCELLANEOUS) IMPLANT
HANDLE STAPLE EGIA 4 XL (STAPLE) ×3 IMPLANT
IRRIG SUCT STRYKERFLOW 2 WTIP (MISCELLANEOUS) ×3
IRRIGATION SUCT STRKRFLW 2 WTP (MISCELLANEOUS) ×1 IMPLANT
IV LACTATED RINGERS 1000ML (IV SOLUTION) ×3 IMPLANT
KIT BASIN OR (CUSTOM PROCEDURE TRAY) ×3 IMPLANT
KIT GASTRIC LAVAGE 34FR ADT (SET/KITS/TRAYS/PACK) IMPLANT
MARKER SKIN DUAL TIP RULER LAB (MISCELLANEOUS) ×3 IMPLANT
MAT PREVALON FULL STRYKER (MISCELLANEOUS) ×3 IMPLANT
NEEDLE SPNL 22GX3.5 QUINCKE BK (NEEDLE) ×3 IMPLANT
NS IRRIG 1000ML POUR BTL (IV SOLUTION) ×3 IMPLANT
PACK CARDIOVASCULAR III (CUSTOM PROCEDURE TRAY) ×3 IMPLANT
PENCIL SMOKE EVACUATOR (MISCELLANEOUS) IMPLANT
RELOAD EGIA 45 MED/THCK PURPLE (STAPLE) ×3 IMPLANT
RELOAD EGIA 45 TAN VASC (STAPLE) IMPLANT
RELOAD EGIA 60 MED/THCK PURPLE (STAPLE) ×9 IMPLANT
RELOAD EGIA 60 TAN VASC (STAPLE) ×12 IMPLANT
RELOAD ENDO STITCH 2.0 (ENDOMECHANICALS) ×24
SCISSORS LAP 5X45 EPIX DISP (ENDOMECHANICALS) ×3 IMPLANT
SET TUBE SMOKE EVAC HIGH FLOW (TUBING) ×3 IMPLANT
SHEARS HARMONIC ACE PLUS 45CM (MISCELLANEOUS) ×3 IMPLANT
SLEEVE XCEL OPT CAN 5 100 (ENDOMECHANICALS) ×9 IMPLANT
SOL ANTI FOG 6CC (MISCELLANEOUS) ×1 IMPLANT
SOLUTION ANTI FOG 6CC (MISCELLANEOUS) ×2
STAPLER VISISTAT 35W (STAPLE) IMPLANT
STRIP CLOSURE SKIN 1/2X4 (GAUZE/BANDAGES/DRESSINGS) ×2 IMPLANT
SUT ETHIBOND 0 36 GRN (SUTURE) IMPLANT
SUT ETHILON 2 0 PS N (SUTURE) IMPLANT
SUT MNCRL AB 4-0 PS2 18 (SUTURE) ×3 IMPLANT
SUT RELOAD ENDO STITCH 2 48X1 (ENDOMECHANICALS) ×6
SUT RELOAD ENDO STITCH 2.0 (ENDOMECHANICALS) ×6
SUT SILK 0 SH 30 (SUTURE) IMPLANT
SUT VICRYL 0 TIES 12 18 (SUTURE) IMPLANT
SUTURE RELOAD END STTCH 2 48X1 (ENDOMECHANICALS) ×6 IMPLANT
SUTURE RELOAD ENDO STITCH 2.0 (ENDOMECHANICALS) ×6 IMPLANT
SYR 20ML LL LF (SYRINGE) ×3 IMPLANT
SYR 50ML LL SCALE MARK (SYRINGE) ×3 IMPLANT
TOWEL OR 17X26 10 PK STRL BLUE (TOWEL DISPOSABLE) ×3 IMPLANT
TOWEL OR NON WOVEN STRL DISP B (DISPOSABLE) ×3 IMPLANT
TROCAR BLADELESS OPT 5 100 (ENDOMECHANICALS) ×3 IMPLANT
TROCAR XCEL 12X100 BLDLESS (ENDOMECHANICALS) ×3 IMPLANT
TUBING CONNECTING 10 (TUBING) ×2 IMPLANT
TUBING CONNECTING 10' (TUBING) ×1

## 2021-02-18 NOTE — Anesthesia Postprocedure Evaluation (Signed)
Anesthesia Post Note  Patient: Crystal Abbott  Procedure(s) Performed: LAPAROSCOPIC ROUX-EN-Y GASTRIC BYPASS WITH UPPER ENDOSCOPY     Patient location during evaluation: PACU Anesthesia Type: General Level of consciousness: sedated and patient cooperative Pain management: pain level controlled Vital Signs Assessment: post-procedure vital signs reviewed and stable Respiratory status: spontaneous breathing Cardiovascular status: stable Anesthetic complications: no   No notable events documented.  Last Vitals:  Vitals:   02/18/21 1600 02/18/21 1700  BP: 127/78 (!) 142/73  Pulse: 78 79  Resp: 18 20  Temp: 36.7 C 36.7 C  SpO2: 96% 97%    Last Pain:  Vitals:   02/18/21 1700  TempSrc: Oral  PainSc:                  Lewie Loron

## 2021-02-18 NOTE — Anesthesia Procedure Notes (Signed)
Procedure Name: Intubation Date/Time: 02/18/2021 9:50 AM Performed by: Vanessa [place], CRNA Pre-anesthesia Checklist: Patient identified, Emergency Drugs available, Suction available and Patient being monitored Patient Re-evaluated:Patient Re-evaluated prior to induction Oxygen Delivery Method: Circle system utilized Preoxygenation: Pre-oxygenation with 100% oxygen Induction Type: IV induction Ventilation: Mask ventilation without difficulty and Oral airway inserted - appropriate to patient size Laryngoscope Size: 2 and Miller Grade View: Grade I Tube type: Oral Tube size: 7.0 mm Number of attempts: 1 Airway Equipment and Method: Stylet Placement Confirmation: ETT inserted through vocal cords under direct vision, positive ETCO2 and breath sounds checked- equal and bilateral Tube secured with: Tape Dental Injury: Teeth and Oropharynx as per pre-operative assessment

## 2021-02-18 NOTE — Progress Notes (Signed)
PHARMACY CONSULT FOR:  Risk Assessment for Post-Discharge VTE Following Bariatric Surgery  Post-Discharge VTE Risk Assessment: This patient's probability of 30-day post-discharge VTE is increased due to the factors marked:   Female    Age >/=60 years    BMI >/=50 kg/m2    CHF    Dyspnea at Rest    Paraplegia  X  Non-gastric-band surgery    Operation Time >/=3 hr    Return to OR     Length of Stay >/= 3 d   Hx of VTE   Hypercoagulable condition   Significant venous stasis    Predicted probability of 30-day post-discharge VTE: 0.16%  Other patient-specific factors to consider: N/A  Recommendation for Discharge: No pharmacologic prophylaxis post-discharge  Crystal Abbott is a 30 y.o. female who underwent laparoscopic gastric bypass on 02/18/2021.    Allergies  Allergen Reactions   Penicillins Rash    Has patient had a PCN reaction causing immediate rash, facial/tongue/throat swelling, SOB or lightheadedness with hypotension: Yes Has patient had a PCN reaction causing severe rash involving mucus membranes or skin necrosis: Yes Has patient had a PCN reaction that required hospitalization No Has patient had a PCN reaction occurring within the last 10 years: No If all of the above answers are "NO", then may proceed with Cephalosporin use.     Patient Measurements: Weight: 120.7 kg (266 lb 3.2 oz) Body mass index is 45.69 kg/m.  No results for input(s): WBC, HGB, HCT, PLT, APTT, CREATININE, LABCREA, CREATININE, CREAT24HRUR, MG, PHOS, ALBUMIN, PROT, ALBUMIN, AST, ALT, ALKPHOS, BILITOT, BILIDIR, IBILI in the last 72 hours. Estimated Creatinine Clearance: 132.8 mL/min (by C-G formula based on SCr of 0.47 mg/dL).   Past Medical History:  Diagnosis Date   Calculi, ureter    right s/p ESWL 11-16-2019   Complication of anesthesia    woke up during wisdom teeth   History of kidney stones    Hx of chlamydia infection    Hx of endometriosis    Hx of varicella    Urgency of  urination    Wears contact lenses     Medications Prior to Admission  Medication Sig Dispense Refill Last Dose   cholecalciferol (VITAMIN D3) 25 MCG (1000 UNIT) tablet Take 1,000 Units by mouth daily.   Past Week   levonorgestrel (MIRENA) 20 MCG/24HR IUD 1 each by Intrauterine route once.      vitamin B-12 (CYANOCOBALAMIN) 1000 MCG tablet Take 1,000 mcg by mouth daily.   Past Week    Crystal Abbott 02/18/2021,12:13 PM

## 2021-02-18 NOTE — Progress Notes (Signed)

## 2021-02-18 NOTE — Op Note (Signed)
   Patient: Crystal Abbott (02-23-1991, [id number])  Date of Surgery: 02/18/2021   Preoperative Diagnosis: morbid obesity   Postoperative Diagnosis: morbid obesity   Surgical Procedure: Upper Endoscopy   Surgeon: Ivar Drape, MD  Anesthesiologist: Lewie Loron, MD CRNA: Vanessa [place], CRNA; Nelle Don, CRNA   Anesthesia: General   Fluids:  Total I/O In: 1100 [I.V.:1000; IV Piggyback:100] Out: 20 [Blood:20]  Complications: None  Drains:  None  Specimen: None   Indications for Procedure: [name] is a 30 y.o. female undergoing laparoscopic gastric bypass and an EGD was requested to evaluate foregut anatomy intraoperatively.  Description of Procedure: During the procedure, I scrubbed out and obtained the Olympus endoscope. I gently placed endoscope in the patient's oropharynx and gently glided it down the esophagus without any difficulty under direct visualization.  The scope was advanced as far as the roux limb and then slowly withdrawn to inspect the foregut anatomy.  Dr. Sheliah Hatch had placed saline in the upper abdomen and all staple lines were submerged to ensure no air leak. There was no evidence of bubbles. There was no evidence of intraluminal bleeding and the mucosa appeared healthy.  The lumen was widely patent without evidence of stricture.  The intraluminal insufflation was decompressed. The scope was withdrawn. The patient tolerated this portion of the procedure well. Please see Dr Guerry Minors operative note for details regarding the remainder of the procedure.    Ivar Drape, MD General, Bariatric, & Minimally Invasive Surgery [facility], [place]

## 2021-02-18 NOTE — Progress Notes (Signed)
Water started @ 1530. Nurse was at lunch which caused the delay.

## 2021-02-18 NOTE — Anesthesia Preprocedure Evaluation (Signed)
Anesthesia Evaluation  Patient identified by MRN, date of birth, ID band Patient awake    Reviewed: Allergy & Precautions, NPO status , Patient's Chart, lab work & pertinent test results  History of Anesthesia Complications Negative for: history of anesthetic complications  Airway Mallampati: II  TM Distance: >3 FB Neck ROM: Full    Dental  (+) Dental Advisory Given, Teeth Intact   Pulmonary neg pulmonary ROS,    Pulmonary exam normal        Cardiovascular negative cardio ROS Normal cardiovascular exam     Neuro/Psych negative neurological ROS  negative psych ROS   GI/Hepatic negative GI ROS, Neg liver ROS,   Endo/Other  Morbid obesity  Renal/GU Renal stones     Musculoskeletal negative musculoskeletal ROS (+)   Abdominal   Peds  Hematology negative hematology ROS (+)   Anesthesia Other Findings Day of surgery medications reviewed with patient.  Reproductive/Obstetrics negative OB ROS                             Anesthesia Physical  Anesthesia Plan  ASA: 3  Anesthesia Plan: General   Post-op Pain Management:    Induction: Intravenous  PONV Risk Score and Plan: 4 or greater and Treatment may vary due to age or medical condition, Ondansetron, Dexamethasone, Midazolam, Scopolamine patch - Pre-op and Aprepitant  Airway Management Planned: Oral ETT  Additional Equipment: None  Intra-op Plan:   Post-operative Plan: Extubation in OR  Informed Consent: I have reviewed the patients History and Physical, chart, labs and discussed the procedure including the risks, benefits and alternatives for the proposed anesthesia with the patient or authorized representative who has indicated his/her understanding and acceptance.     Dental advisory given  Plan Discussed with: CRNA  Anesthesia Plan Comments:         Anesthesia Quick Evaluation

## 2021-02-18 NOTE — Transfer of Care (Signed)
Immediate Anesthesia Transfer of Care Note  Patient: Crystal Abbott  Procedure(s) Performed: LAPAROSCOPIC ROUX-EN-Y GASTRIC BYPASS WITH UPPER ENDOSCOPY  Patient Location: PACU  Anesthesia Type:General  Level of Consciousness: awake and patient cooperative  Airway & Oxygen Therapy: Patient Spontanous Breathing and Patient connected to face mask  Post-op Assessment: Report given to RN and Post -op Vital signs reviewed and stable  Post vital signs: Reviewed and stable  Last Vitals:  Vitals Value Taken Time  BP 142/72 02/18/21 1147  Temp 36.7 C 02/18/21 1147  Pulse 84 02/18/21 1149  Resp 29 02/18/21 1149  SpO2 96 % 02/18/21 1149  Vitals shown include unvalidated device data.  Last Pain:  Vitals:   02/18/21 0833  TempSrc:   PainSc: 0-No pain         Complications: No notable events documented.

## 2021-02-18 NOTE — Op Note (Signed)
**Note Crystal-Identified via Obfuscation** Preop Diagnosis: Obesity Class III  Postop Diagnosis: same  Procedure performed: laparoscopic Roux en Y gastric bypass  Assitant: Ivar Drape  Indications:  The patient is a 30 y.o. year-old morbidly obese female who has been followed in the Bariatric Clinic as an outpatient. This patient was diagnosed with morbid obesity with a BMI of Body mass index is 45.69 kg/m. The patient was counseled extensively in the Bariatric Outpatient Clinic and after a thorough explanation of the risks and benefits of surgery (including death from complications, bowel leak, infection such as peritonitis and/or sepsis, internal hernia, bleeding, need for blood transfusion, bowel obstruction, organ failure, pulmonary embolus, deep venous thrombosis, wound infection, incisional hernia, skin breakdown, and others entailed on the consent form) and after a compliant diet and exercise program, the patient was scheduled for an elective laparoscopic gastric bypass.  Description of Operation:  Following informed consent, the patient was taken to the operating room and placed on the operating table in the supine position.  She had previously received prophylactic antibiotics and subcutaneous heparin for DVT prophylaxis in the pre-op holding area.  After induction of general endotracheal anesthesia by the anesthesiologist, the patient underwent placement of sequential compression devices, Foley catheter and an oro-gastric tube.  A timeout was confirmed by the surgery and anesthesia teams.  The patient was adequately padded at all pressure points and placed on a footboard to prevent slippage from the OR table during extremes of position during surgery.  She underwent a routine sterile prep and drape of her entire abdomen.    Next, A transverse incision was made under the left subcostal area and a 4mm optical viewing trocar was introduced into the peritoneal cavity. Pneumoperitoneum was applied with a high flow and low pressure.  A laparoscope was inserted to confirm placement. A extraperitoneal block was then placed at the lateral abdominal wall using exparel diluted with marcaine . 5 additional trocars were placed: 1 56mm trocar to the left of the midline. 1 additional 44mm trocar in the left lateral area, 1 47mm trocar in the right mid abdomen, and 1 52mm trocar in the right subcostal area.  The greater omentum was flipped over the transverse colon and under the left lobe of the liver. The ligament of trietz was identified. 40cm of jejunum was measured starting from the ligament of Trietz. The mesentery was checked to ensure mobility. Next, a 49mm 2-60mm tristapler was used to divide the jejunum at this location. The harmonic scalpel was used to divide the mesentery down to the origin. A 1/2" penrose was sutured to the distal side. 100cm of jejunum was measured starting at the division. 2-0 silk was used to appose the biliary limb to the 100cm mark of jejunum in 2 places. Enterotomies were made in the biliary and common channels and a 54mm 2-3 tristapler was used to create the J-J anastomosis. A 2-0 silk was used to appose the enterotomy edges and a 47mm 2-3 tristapler was used to close the enterotomy. An anti-obstruction 2-0 silk suture was placed. Next, the mesenteric defect was closed with a 2-0 silk in running fashion.The J-J appeared patent and in neutral position.  Next, the omentum was divided using the Harmonic scalpel. The patient was placed in steep Reverse Trendelenberg position. A Nathanson retracted was placed through a subxiphoid incision and used to retract the liver. The fat pad over the fundus was incised to free the fundus. Next, a position along the lesser curve 6cm from GE junction was identified. The pars  flaccida was entered and the fat over the lesser curve divided to enter the lesser sac. Multiple 59mm 3-91mm tristaple firings were peformed to create a 6cm pouch. The Roux limb was identified using the placed penrose  and brought up to the stomach in antecolic fashion. The limb was inspected to ensure a neutral position. A 2-0 vicryl suture was then used to create a posterior layer connecting the stomach to the Roux limb jejunum in running fashion. Next cautery was used to create an enterotomy along the medial aspect of this suture line and Harmonic scalpel used to create gastotomy. A 86mm 3-80mm tristapler was then used to create a 25-41mm anastomosis. 2 2-0 vicryl sutures were used in running fashion to close the gastrotomy. Finally, a 2-0 vicryl suture was used to close an anterior layer of stomach and jejunum over the anastomosis in running fashion. The penrose was removed from the Roux limb. A 2-0 silk was used to appose the transverse mesocolon to the mesentery of the Roux limb.   The assistant then went and performed an upper endoscopy and leak test. No bubbles were seen and the pouch and limb distended appropriately. The limb and pouch were deflated, the endoscope was removed. Hemostasis was ensured. Pneumoperitoneum was evacuated, all ports were removed and all incisions closed with 4-0 monocryl suture in subcuticular fashion. Steristrips and bandaids were put in place for dressing. The patient awoke from anesthesia and was brought to pacu in stable condition. All counts were correct.  Specimens:  None  Estimated Blood Loss: 20 ml  Local Anesthesia: 50 ml Exparel: 0.5% Marcaine Mix  Post-Op Plan:       Pain Management: PO, prn      Antibiotics: Prophylactic      Anticoagulation: Prophylactic, Starting now      Post Op Studies/Consults: Not applicable      Intended Discharge: within 48h      Intended Outpatient Follow-Up: Two Week      Intended Outpatient Studies: Not Applicable      Other: Not Applicable   Crystal Abbott

## 2021-02-18 NOTE — H&P (Signed)
Chief Complaint: Pre-op Exam   History of Present Illness: Crystal Abbott is a 30 y.o. female who is seen today for bariatric follow up.  She has completed all requirements. She has no new medical issues or medications.  Review of Systems: A complete review of systems was obtained from the patient. I have reviewed this information and discussed as appropriate with the patient. See HPI as well for other ROS.  Review of Systems  Constitutional: Negative.  HENT: Negative.  Eyes: Negative.  Respiratory: Negative.  Cardiovascular: Negative.  Gastrointestinal: Negative.  Genitourinary: Negative.  Musculoskeletal: Negative.  Skin: Negative.  Neurological: Negative.  Endo/Heme/Allergies: Negative.  Psychiatric/Behavioral: Negative.   Medical History: Past Medical History:  Diagnosis Date   Hyperlipidemia   There is no problem list on file for this patient.  Past Surgical History:  Procedure Laterality Date   LITHOTRIPSY 2021    Allergies  Allergen Reactions   Penicillins Rash  Has patient had a PCN reaction causing immediate rash, facial/tongue/throat swelling, SOB or lightheadedness with hypotension: Yes Has patient had a PCN reaction causing severe rash involving mucus membranes or skin necrosis: Yes Has patient had a PCN reaction that required hospitalization No Has patient had a PCN reaction occurring within the last 10 years: No If all of the above answers are "NO", then may proceed with Cephalosporin use.   Current Outpatient Medications on File Prior to Visit  Medication Sig Dispense Refill   levonorgestreL (MIRENA) 20 mcg/24 hr (8 years) IUD Mirena 20 mcg/24 hours (8 yrs) 52 mg intrauterine device Take 1 device by intrauterine route.   No current facility-administered medications on file prior to visit.   Family History  Problem Relation Age of Onset   Hyperlipidemia (Elevated cholesterol) Mother   Obesity Mother   High blood pressure (Hypertension)  Mother   Hyperlipidemia (Elevated cholesterol) Father   Obesity Father   Obesity Sister    Social History   Tobacco Use  Smoking Status Never Smoker  Smokeless Tobacco Never Used    Social History   Socioeconomic History   Marital status: Married  Tobacco Use   Smoking status: Never Smoker   Smokeless tobacco: Never Used  Substance and Sexual Activity   Alcohol use: Yes  Alcohol/week: 1.0 standard drink  Types: 1 Glasses of wine per week   Drug use: Never   Objective:   Vitals:  02/06/21 0944  BP: 126/80  Pulse: 92  Temp: 36.6 C (97.8 F)  Weight: (!) 123.2 kg (271 lb 9.6 oz)  Height: 162.6 cm (5\' 4" )   Body mass index is 46.62 kg/m.  Physical Exam Constitutional:  Appearance: Normal appearance.  HENT:  Head: Normocephalic and atraumatic.  Pulmonary:  Effort: Pulmonary effort is normal.  Musculoskeletal:  General: Normal range of motion.  Cervical back: Normal range of motion.  Neurological:  General: No focal deficit present.  Mental Status: She is alert and oriented to person, place, and time. Mental status is at baseline.  Psychiatric:  Mood and Affect: Mood normal.  Behavior: Behavior normal.  Thought Content: Thought content normal.    Assessment and Plan:  Diagnoses and all orders for this visit:  Morbid (severe) obesity due to excess calories (CMS-HCC)  Moderate mixed hyperlipidemia not requiring statin therapy    The patient meets weight loss surgery criteria. Due to the above reasons, I think laparoscopic RNY gastric bypass is the best option for the patient.   We discussed RNY gastric bypass. We discussed the preoperative, operative and  postoperative process. I explained the surgery in detail including the performance of an EGD near the end of the surgery to test for leak. We discussed the typical hospital course including a 1-2 day stay baring any complications. The patient was given Agricultural engineer. We did discuss the possibility  of weight regain several years after the procedure.  The risks of infection, bleeding, pain, scarring, weight regain, too little or too much weight loss, vitamin deficiencies and need for lifelong vitamin supplementation, hair loss, need for protein supplementation, leaks, stricture, reflux, food intolerance, gallstone formation, hernia, need for reoperation, need for open surgery, injury to spleen or surrounding structures, DVT's, PE, and death again discussed with the patient and the patient expressed understanding and desires to proceed with laparoscopic Roux en Y gastric bypass, possible open, intraoperative endoscopy.  We discussed that before and after surgery that there would be an alteration in their diet. I explained that we may put them on a diet 2 weeks before surgery. I also explained that they would be on a liquid diet for 2 weeks after surgery. We discussed that they would have to avoid certain foods after surgery. We discussed the importance of physical activity as well as compliance with our dietary and supplement recommendations and routine follow-up.

## 2021-02-19 ENCOUNTER — Encounter (HOSPITAL_COMMUNITY): Payer: Self-pay | Admitting: General Surgery

## 2021-02-19 LAB — COMPREHENSIVE METABOLIC PANEL
ALT: 24 U/L (ref 0–44)
AST: 16 U/L (ref 15–41)
Albumin: 3.7 g/dL (ref 3.5–5.0)
Alkaline Phosphatase: 57 U/L (ref 38–126)
Anion gap: 5 (ref 5–15)
BUN: 6 mg/dL (ref 6–20)
CO2: 23 mmol/L (ref 22–32)
Calcium: 8.9 mg/dL (ref 8.9–10.3)
Chloride: 109 mmol/L (ref 98–111)
Creatinine, Ser: 0.58 mg/dL (ref 0.44–1.00)
GFR, Estimated: >60 mL/min (ref >60)
Glucose, Bld: 122 mg/dL — ABNORMAL HIGH (ref 70–99)
Potassium: 3.7 mmol/L (ref 3.5–5.1)
Sodium: 137 mmol/L (ref 135–145)
Total Bilirubin: 0.4 mg/dL (ref 0.3–1.2)
Total Protein: 7.1 g/dL (ref 6.5–8.1)

## 2021-02-19 LAB — CBC WITH DIFFERENTIAL/PLATELET
Abs Immature Granulocytes: 0.04 10*3/uL (ref 0.00–0.07)
Basophils Absolute: 0 10*3/uL (ref 0.0–0.1)
Basophils Relative: 0 %
Eosinophils Absolute: 0 10*3/uL (ref 0.0–0.5)
Eosinophils Relative: 0 %
HCT: 37.5 % (ref 36.0–46.0)
Hemoglobin: 12.7 g/dL (ref 12.0–15.0)
Immature Granulocytes: 0 %
Lymphocytes Relative: 11 %
Lymphs Abs: 1.1 10*3/uL (ref 0.7–4.0)
MCH: 29 pg (ref 26.0–34.0)
MCHC: 33.9 g/dL (ref 30.0–36.0)
MCV: 85.6 fL (ref 80.0–100.0)
Monocytes Absolute: 0.7 10*3/uL (ref 0.1–1.0)
Monocytes Relative: 7 %
Neutro Abs: 8 10*3/uL — ABNORMAL HIGH (ref 1.7–7.7)
Neutrophils Relative %: 82 %
Platelets: 228 10*3/uL (ref 150–400)
RBC: 4.38 MIL/uL (ref 3.87–5.11)
RDW: 12.6 % (ref 11.5–15.5)
WBC: 9.8 10*3/uL (ref 4.0–10.5)
nRBC: 0 % (ref 0.0–0.2)

## 2021-02-19 MED ORDER — PANTOPRAZOLE SODIUM 40 MG PO TBEC: 40.0000 mg | DELAYED_RELEASE_TABLET | Freq: Every day | ORAL | 0 refills | Status: DC

## 2021-02-19 MED ORDER — ONDANSETRON 4 MG PO TBDP: 4.0000 mg | ORAL_TABLET | Freq: Four times a day (QID) | ORAL | 0 refills | Status: DC | PRN

## 2021-02-19 MED ORDER — ACETAMINOPHEN 500 MG PO TABS: 1000.0000 mg | ORAL_TABLET | Freq: Three times a day (TID) | ORAL | 0 refills | Status: AC

## 2021-02-19 NOTE — Discharge Instructions (Signed)

## 2021-02-19 NOTE — Plan of Care (Signed)
  Problem: Education: Goal: Ability to state signs and symptoms to report to health care provider will improve Outcome: Adequate for Discharge Goal: Knowledge of the prescribed self-care regimen will improve Outcome: Adequate for Discharge Goal: Knowledge of discharge needs will improve Outcome: Adequate for Discharge   Problem: Activity: Goal: Ability to tolerate increased activity will improve Outcome: Adequate for Discharge   Problem: Bowel/Gastric: Goal: Gastrointestinal status for postoperative course will improve Outcome: Adequate for Discharge Goal: Occurrences of nausea will decrease Outcome: Adequate for Discharge   Problem: Coping: Goal: Development of coping mechanisms to deal with changes in body function or appearance will improve Outcome: Adequate for Discharge   Problem: Fluid Volume: Goal: Maintenance of adequate hydration will improve Outcome: Adequate for Discharge   Problem: Nutritional: Goal: Nutritional status will improve Outcome: Adequate for Discharge   Problem: Clinical Measurements: Goal: Will show no signs or symptoms of venous thromboembolism Outcome: Adequate for Discharge Goal: Will remain free from infection Outcome: Adequate for Discharge Goal: Will show no signs of GI Leak Outcome: Adequate for Discharge   Problem: Respiratory: Goal: Will regain and/or maintain adequate ventilation Outcome: Adequate for Discharge   Problem: Pain Management: Goal: Pain level will decrease Outcome: Adequate for Discharge   Problem: Skin Integrity: Goal: Demonstration of wound healing without infection will improve Outcome: Adequate for Discharge   Problem: Education: Goal: Knowledge of General Education information will improve Description: Including pain rating scale, medication(s)/side effects and non-pharmacologic comfort measures Outcome: Adequate for Discharge   Problem: Health Behavior/Discharge Planning: Goal: Ability to manage  health-related needs will improve Outcome: Adequate for Discharge   Problem: Clinical Measurements: Goal: Ability to maintain clinical measurements within normal limits will improve Outcome: Adequate for Discharge Goal: Will remain free from infection Outcome: Adequate for Discharge Goal: Diagnostic test results will improve Outcome: Adequate for Discharge Goal: Respiratory complications will improve Outcome: Adequate for Discharge Goal: Cardiovascular complication will be avoided Outcome: Adequate for Discharge   Problem: Activity: Goal: Risk for activity intolerance will decrease Outcome: Adequate for Discharge   Problem: Nutrition: Goal: Adequate nutrition will be maintained Outcome: Adequate for Discharge   Problem: Coping: Goal: Level of anxiety will decrease Outcome: Adequate for Discharge   Problem: Elimination: Goal: Will not experience complications related to bowel motility Outcome: Adequate for Discharge Goal: Will not experience complications related to urinary retention Outcome: Adequate for Discharge   Problem: Pain Managment: Goal: General experience of comfort will improve Outcome: Adequate for Discharge   Problem: Safety: Goal: Ability to remain free from injury will improve Outcome: Adequate for Discharge   Problem: Skin Integrity: Goal: Risk for impaired skin integrity will decrease Outcome: Adequate for Discharge   

## 2021-02-19 NOTE — Discharge Summary (Signed)
Physician Discharge Summary  Crystal Abbott VEH:[id number] DOB: [birth year] DOA: 02/18/2021  PCP: Adela Glimpse, NP  Admit date: 02/18/2021 Discharge date: 02/19/2021  Recommendations for Outpatient Follow-up:   (include homehealth, outpatient follow-up instructions, specific recommendations for PCP to follow-up on, etc.)   Follow-up Information     [name], [name], MD. Go on 03/13/2021.   Specialty: General Surgery Why: at 11:00am.  Please arrive 15 minutes prior to your appointment time.  Thank you. Contact information: [address] [place] [address] [phone]         [facility] [place]. Go on 04/11/2021.   Specialty: General Surgery Why: at 9:00am with Dr. Sheliah Hatch.  Please arrive 15 minutes prior to your appointment time.  Thank you. Contact information: [address] [place] [address] [phone]                Discharge Diagnoses:  Active Problems:   Morbid obesity (HCC)   Surgical Procedure: Roux-en-Y gastric bypass, upper endoscopy  Discharge Condition: Good Disposition: Home  Diet recommendation: Postoperative sleeve gastrectomy diet (liquids only)  Filed Weights   02/18/21 0824 02/18/21 1700  Weight: 120.7 kg 120.7 kg     Hospital Course:  The patient was admitted after undergoing Roux-en-Y gastric bypass. POD 0 she ambulated well. POD 1 she was started on the water diet protocol and tolerated 500 ml in the first shift. Once meeting the water amount she was advanced to bariatric protein shakes which they tolerated and were discharged home POD 1.  Treatments: surgery: Roux-en-Y gastric bypass  Discharge Instructions  Discharge Instructions     Ambulate hourly while awake   Complete by: As directed    Call MD for:  difficulty breathing, headache or visual disturbances   Complete by: As directed    Call MD for:  persistant dizziness or light-headedness   Complete by: As directed    Call  MD for:  persistant nausea and vomiting   Complete by: As directed    Call MD for:  redness, tenderness, or signs of infection (pain, swelling, redness, odor or green/yellow discharge around incision site)   Complete by: As directed    Call MD for:  severe uncontrolled pain   Complete by: As directed    Call MD for:  temperature >101 F   Complete by: As directed    Diet bariatric full liquid   Complete by: As directed    Discharge wound care:   Complete by: As directed    Remove Bandaids tomorrow, ok to shower tomorrow. Steristrips may fall off in 1-3 weeks.   Incentive spirometry   Complete by: As directed    Perform hourly while awake      Allergies as of 02/19/2021       Reactions   Penicillins Rash   Has patient had a PCN reaction causing immediate rash, facial/tongue/throat swelling, SOB or lightheadedness with hypotension: Yes Has patient had a PCN reaction causing severe rash involving mucus membranes or skin necrosis: Yes Has patient had a PCN reaction that required hospitalization No Has patient had a PCN reaction occurring within the last 10 years: No If all of the above answers are "NO", then may proceed with Cephalosporin use.        Medication List     TAKE these medications    acetaminophen 500 MG tablet Commonly known as: TYLENOL Take 2 tablets (1,000 mg total) by mouth every 8 (eight) hours for 5 days.  cholecalciferol 25 MCG (1000 UNIT) tablet Commonly known as: VITAMIN D3 Take 1,000 Units by mouth daily.   levonorgestrel 20 MCG/24HR IUD Commonly known as: MIRENA 1 each by Intrauterine route once.   ondansetron 4 MG disintegrating tablet Commonly known as: ZOFRAN-ODT Take 1 tablet (4 mg total) by mouth every 6 (six) hours as needed for nausea or vomiting.   pantoprazole 40 MG tablet Commonly known as: PROTONIX Take 1 tablet (40 mg total) by mouth daily.   vitamin B-12 1000 MCG tablet Commonly known as: CYANOCOBALAMIN Take 1,000 mcg by mouth  daily.               Discharge Care Instructions  (From admission, onward)           Start     Ordered   02/19/21 0000  Discharge wound care:       Comments: Remove Bandaids tomorrow, ok to shower tomorrow. Steristrips may fall off in 1-3 weeks.   02/19/21 0867            Follow-up Information     [name], [name], MD. Go on 03/13/2021.   Specialty: General Surgery Why: at 11:00am.  Please arrive 15 minutes prior to your appointment time.  Thank you. Contact information: [address] [place] [address] [phone]         [facility], [place]. Go on 04/11/2021.   Specialty: General Surgery Why: at 9:00am with Dr. Sheliah Hatch.  Please arrive 15 minutes prior to your appointment time.  Thank you. Contact information: [address] [place] [address] [phone]                  The results of significant diagnostics from this hospitalization (including imaging, microbiology, ancillary and laboratory) are listed below for reference.    Significant Diagnostic Studies: DG Chest 2 View per protocol  Result Date: 02/06/2021 CLINICAL DATA:  Preop exam EXAM: CHEST - 2 VIEW COMPARISON:  None. FINDINGS: The cardiomediastinal silhouette is within normal limits. No pleural effusion. No pneumothorax. No mass or consolidation. No acute osseous abnormality. IMPRESSION: No acute findings in the chest. Electronically Signed   By: Olive Bass M.D.   On: 02/06/2021 17:05    Labs: Basic Metabolic Panel: Recent Labs  Lab 02/19/21 0409  NA 137  K 3.7  CL 109  CO2 23  GLUCOSE 122*  BUN 6  CREATININE 0.58  CALCIUM 8.9   Liver Function Tests: Recent Labs  Lab 02/19/21 0409  AST 16  ALT 24  ALKPHOS 57  BILITOT 0.4  PROT 7.1  ALBUMIN 3.7    CBC: Recent Labs  Lab 02/18/21 1226 02/19/21 0409  WBC  --  9.8  NEUTROABS  --  8.0*  HGB 13.5 12.7  HCT 39.5 37.5  MCV  --  85.6  PLT  --  228    CBG: No results  for input(s): GLUCAP in the last 168 hours.  Active Problems:   Morbid obesity (HCC)   VTE plan: no chemical prophylaxis recommended ([email or web address])  Time coordinating discharge: 15 min

## 2021-02-19 NOTE — Progress Notes (Signed)
Patient alert and oriented, pain is controlled. Patient is tolerating fluids, advanced to protein shake today, patient is tolerating well. Reviewed Gastric Bypass discharge instructions with patient and patient is able to articulate understanding. Provided information on BELT program, Support Group and [facility] outpatient pharmacy. All questions answered, will continue to monitor.    

## 2021-02-19 NOTE — Progress Notes (Signed)
24hr fluid recall: 600mL.  Per dehydration protocol, will call pt to f/u within one week post op. 

## 2021-02-21 ENCOUNTER — Telehealth (HOSPITAL_COMMUNITY): Payer: Self-pay | Admitting: *Deleted

## 2021-02-21 NOTE — Telephone Encounter (Signed)
1.  Tell me about your pain and pain management? Pt denies any current pain.  Pt states that she just has some "tenderness" at the sites.  No pain medication needed.  2.  Let's talk about fluid intake.  How much total fluid are you taking in? Pt states that she is working to meet goal of 64 oz of fluid today.  Pt has consumed average of 42 fl oz daily.  Pt encouraged to continue to work towards meeting goal.  Pt instructed to assess status and suggestions daily utilizing Hydration Action Plan on discharge folder and to call [facility] if in the "red zone".   3.  How much protein have you taken in the last 2 days? Pt states that she is working to meet goal of goal of 60g of protein today.  Pt has been able to consume at least one protein shake daily.  Reinforced need to meet protein goal daily and to prioritize drinking protein before drinking other liquids.   4.  Have you had nausea?  Tell me about when have experienced nausea and what you did to help? Pt denies nausea.   5.  Has the frequency or color changed with your urine? Pt states that she is urinating "fine" with no changes in frequency or urgency.     6.  Tell me what your incisions look like? "Incisions look fine". Pt denies a fever, chills.  Pt states incisions are not swollen, open, or draining.  Pt encouraged to call [facility] if incisions change.   7.  Have you been passing gas? BM? Pt states that she has not had a BM.  Pt has taken Miralax as instructed per "Gastric Bypass/Sleeve Discharge Home Care Instructions".  Pt to call surgeon's office if not able to have BM with medication.   8.  If a problem or question were to arise who would you call?  Do you know contact numbers for [facility], [facility], and [facility]? Pt denies dehydration symptoms.  Pt can describe s/sx of dehydration.  Pt knows to call [facility] for surgical, [facility] for nutrition, and [facility] for non-urgent questions or concerns.   9.  How has the walking going? Pt states she is walking around and able  to be active without difficulty.   10. Are you still using your incentive spirometer?  If so, how often? Pt states that she is doing I.S. hourly. Pt encouraged to use incentive spirometer, at least 10x every hour while awake until she sees the surgeon.  11.  How are your vitamins and calcium going?  How are you taking them? Pt states that she starting taking supplements today.  Reinforced teaching to take supplements at least two hours apart.  Reminded patient that the first 30 days post-operatively are important for successful recovery.  Practice good hand hygiene, wearing a mask when appropriate (since optional in most places), and minimizing exposure to people who live outside of the home, especially if they are exhibiting any respiratory, GI, or illness-like symptoms.

## 2021-03-04 ENCOUNTER — Encounter: Payer: BC Managed Care – PPO | Attending: General Surgery | Admitting: Skilled Nursing Facility1

## 2021-03-04 ENCOUNTER — Other Ambulatory Visit: Payer: Self-pay

## 2021-03-04 NOTE — Progress Notes (Signed)
2 Week Post-Operative Nutrition Class   Patient was seen on 03/04/2021 for Post-Operative Nutrition education at the [facility].    Surgery date: 02/18/2021 Surgery type: RYGB Start weight at [facility]: 278 Weight today: 253.8 pounds Bowel Habits: Every day to every other day no complaints   Body Composition Scale 03/04/2021  Current Body Weight 253.8  Total Body Fat % 43.8  Visceral Fat 12  Fat-Free Mass % 56.1   Total Body Water % 42.5  Muscle-Mass lbs 34.1  BMI 41.9  Body Fat Displacement          Torso  lbs 69         Left Leg  lbs 13.8         Right Leg  lbs 13.8         Left Arm  lbs 6.9         Right Arm   lbs 6.9      The following the learning objectives were met by the patient during this course: Identifies Phase 3 (Soft, High Proteins) Dietary Goals and will begin from 2 weeks post-operatively to 2 months post-operatively Identifies appropriate sources of fluids and proteins  Identifies appropriate fat sources and healthy verses unhealthy fat types   States protein recommendations and appropriate sources post-operatively Identifies the need for appropriate texture modifications, mastication, and bite sizes when consuming solids Identifies appropriate fat consumption and sources Identifies appropriate multivitamin and calcium sources post-operatively Describes the need for physical activity post-operatively and will follow MD recommendations States when to call healthcare provider regarding medication questions or post-operative complications   Handouts given during class include: Phase 3A: Soft, High Protein Diet Handout Phase 3 High Protein Meals Healthy Fats   Follow-Up Plan: Patient will follow-up at [facility] in 6 weeks for 2 month post-op nutrition visit for diet advancement per MD.

## 2021-03-10 ENCOUNTER — Telehealth: Payer: Self-pay | Admitting: Dietician

## 2021-03-10 NOTE — Telephone Encounter (Signed)
RD called pt to verify fluid intake once starting soft, solid proteins 2 week post-bariatric surgery.   Daily Fluid intake: 40 oz., pt is able to gradually increase each day. Daily Protein intake: 60 g Bowel Habits:  Every other day  Concerns/issues:   Pt is doing well with protein. Started having fish, canned chicken, lunch meat. Pt initially struggled getting enough fluids, but has been able to increase their intake consistently. Pt is confident they will reach their goal very soon. No N/V/D/C. Taking multivitamins and calcium as directed.

## 2021-03-19 DIAGNOSIS — Z20828 Contact with and (suspected) exposure to other viral communicable diseases: Secondary | ICD-10-CM | POA: Diagnosis not present

## 2021-04-11 DIAGNOSIS — Z9884 Bariatric surgery status: Secondary | ICD-10-CM | POA: Diagnosis not present

## 2021-04-11 DIAGNOSIS — K912 Postsurgical malabsorption, not elsewhere classified: Secondary | ICD-10-CM | POA: Diagnosis not present

## 2021-04-11 DIAGNOSIS — E569 Vitamin deficiency, unspecified: Secondary | ICD-10-CM | POA: Diagnosis not present

## 2021-04-11 DIAGNOSIS — T733XXD Exhaustion due to excessive exertion, subsequent encounter: Secondary | ICD-10-CM | POA: Diagnosis not present

## 2021-04-16 ENCOUNTER — Encounter: Payer: BC Managed Care – PPO | Attending: General Surgery | Admitting: Skilled Nursing Facility1

## 2021-04-16 ENCOUNTER — Other Ambulatory Visit: Payer: Self-pay

## 2021-04-16 NOTE — Progress Notes (Signed)
Bariatric Nutrition Follow-Up Visit Medical Nutrition Therapy   NUTRITION ASSESSMENT    Surgery date: 02/18/2021 Surgery type: RYGB Start weight at [facility]: 278 Weight today: 236.1 pounds   Body Composition Scale 03/04/2021 04/16/2021  Current Body Weight 253.8 236.1  Total Body Fat % 43.8 41.4  Visceral Fat 12 10  Fat-Free Mass % 56.1 58.5   Total Body Water % 42.5 43.7  Muscle-Mass lbs 34.1 34.5  BMI 41.9 38  Body Fat Displacement           Torso  lbs 69 60.6         Left Leg  lbs 13.8 12.1         Right Leg  lbs 13.8 12.1         Left Arm  lbs 6.9 6.0         Right Arm   lbs 6.9 6.0   Clinical  Medical hx: endometriosis, kidney stone Medications: N/A  Labs:  Notable signs/symptoms: N/A Any previous deficiencies? No   Lifestyle & Dietary Hx  Pt states she has not been taking the extra b12 and vitamin D waiting on some labs she got.    Pt states she has had to fill in more at work and they are understaffed so it makes it difficult to reach her fluid goal.   Estimated daily fluid intake: 45-50 oz Estimated daily protein intake: 60+ g Supplements: multi and calcium Current average weekly physical activity: gym 4 days a week sometimes 5 days: eliptical or stationary bike for 10 minutes and then weights and then treadmill 15-20 minutes    24-Hr Dietary Recall First Meal 7:30-8: protein shake or yogurt Snack:   Second Meal: yogurt or lunch meat + cheese Snack:  rolled up lunch meat or [place] sausage stick Third Meal: chicken or fish or shrimp or soup Snack:  Beverages: water, some coffee + almond milk or protein shake  Post-Op Goals/ Signs/ Symptoms Using straws: no Drinking while eating: no Chewing/swallowing difficulties: no Changes in vision: no Changes to mood/headaches: no Hair loss/changes to skin/nails: no Difficulty focusing/concentrating: no Sweating: no Limb weakness: no Dizziness/lightheadedness: no Palpitations: no  Carbonated/caffeinated  beverages: no N/V/D/C/Gas: no Abdominal pain: no Dumping syndrome: no    NUTRITION DIAGNOSIS  Overweight/obesity ([place]-3.3) related to past poor dietary habits and physical inactivity as evidenced by completed bariatric surgery and following dietary guidelines for continued weight loss and healthy nutrition status.     NUTRITION INTERVENTION Nutrition counseling (C-1) and education (E-2) to facilitate bariatric surgery goals, including: Diet advancement to the next phase (phase 4) now including non starchy vegetables  The importance of consuming adequate calories as well as certain nutrients daily due to the body's need for essential vitamins, minerals, and fats The importance of daily physical activity and to reach a goal of at least 150 minutes of moderate to vigorous physical activity weekly (or as directed by their physician) due to benefits such as increased musculature and improved lab values The importance of intuitive eating specifically learning hunger-satiety cues and understanding the importance of learning a new body: The importance of mindful eating to avoid grazing behaviors   Goals: -Continue to aim for a minimum of 64 fluid ounces 7 days a week with at least 30 ounces being plain water -Eat non-starchy vegetables 2 times a day 7 days a week -Start out with soft cooked vegetables today and tomorrow; if tolerated begin to eat raw vegetables or cooked including salads -Eat your 3 ounces of  protein first then start in on your non-starchy vegetables; once you understand how much of your meal leads to satisfaction and not full while still eating 3 ounces of protein and non-starchy vegetables you can eat them in any order  -Continue to aim for 30 minutes of activity at least 5 times a week -Do NOT cook with/add to your food: alfredo sauce, cheese sauce, barbeque sauce, ketchup, fat back, butter, bacon grease, grease, Crisco, OR SUGAR   Handouts Provided Include  Phase 4  Learning  Style & Readiness for Change Teaching method utilized: Visual & Auditory  Demonstrated degree of understanding via: Teach Back  Readiness Level: action Barriers to learning/adherence to lifestyle change: none identified   RD's Notes for Next Visit Assess adherence to pt chosen goals    MONITORING & EVALUATION Dietary intake, weekly physical activity, body weight  Next Steps Patient is to follow-up in March

## 2021-06-17 ENCOUNTER — Ambulatory Visit: Payer: BC Managed Care – PPO

## 2021-07-15 ENCOUNTER — Encounter: Payer: Self-pay | Attending: General Surgery | Admitting: Skilled Nursing Facility1

## 2021-07-15 DIAGNOSIS — Z713 Dietary counseling and surveillance: Secondary | ICD-10-CM | POA: Insufficient documentation

## 2021-07-15 DIAGNOSIS — Z6834 Body mass index (BMI) 34.0-34.9, adult: Secondary | ICD-10-CM | POA: Insufficient documentation

## 2021-07-15 DIAGNOSIS — E782 Mixed hyperlipidemia: Secondary | ICD-10-CM | POA: Insufficient documentation

## 2021-07-17 NOTE — Progress Notes (Signed)
Follow-up visit:  Post-Operative 07/16/2021 Surgery ? ?Medical Nutrition Therapy:  Appt start time: 6:00pm end time:  7:00pm ? ?Primary concerns today: Post-operative Bariatric Surgery Nutrition Management 6 Month Post-Op Class ? ?Surgery date: 02/18/2021 ?Surgery type: RYGB ?Start weight at [facility]: 278 ?Weight today: 213.3 pounds ?  ?Body Composition Scale 03/04/2021 04/16/2021 07/16/2021  ?Current Body Weight 253.8 236.1 213.3  ?Total Body Fat % 43.8 41.4 38.5  ?Visceral Fat 12 10 9   ?Fat-Free Mass % 56.1 58.5 61.4  ? Total Body Water % 42.5 43.7 45.2  ?Muscle-Mass lbs 34.1 34.5 34.2  ?BMI 41.9 38 34.3  ?Body Fat Displacement     ?       Torso  lbs 69 60.6 50.8  ?       Left Leg  lbs 13.8 12.1 10.1  ?       Right Leg  lbs 13.8 12.1 10.1  ?       Left Arm  lbs 6.9 6.0 5.0  ?       Right Arm   lbs 6.9 6.0 5.0  ? ? ? ?Information Reviewed/ Discussed During Appointment: ?-Review of composition scale numbers ?-Fluid requirements (64-100 ounces) ?-Protein requirements (60-80g) ?-Strategies for tolerating diet ?-Advancement of diet to include Starchy vegetables ?-Barriers to inclusion of new foods ?-Inclusion of appropriate multivitamin and calcium supplements  ?-Exercise recommendations  ? ?Fluid intake: adequate  ? ?Medications: See List ?Supplementation: appropriate  ? ?Using straws: no ?Drinking while eating: no ?Having you been chewing well: yes ?Chewing/swallowing difficulties: no ?Changes in vision: no ?Changes to mood/headaches: no ?Hair loss/Cahnges to skin/Changes to nails: no ?Any difficulty focusing or concentrating: no ?Sweating: no ?Dizziness/Lightheaded: no ?Palpitations: no  ?Carbonated beverages: no ?N/V/D/C/GAS: no ?Abdominal Pain: no ?Dumping syndrome: no ? ?Recent physical activity:  ADL's ? ?Progress Towards Goal(s):  In Progress ?Teaching method utilized: Visual & Auditory  ?Demonstrated degree of understanding via: Teach Back  ?Readiness Level: Action ?Barriers to learning/adherence to lifestyle  change: none identified ? ?Handouts given during visit include: ?Phase V diet Progression  ?Goals Sheet ?The Benefits of Exercise are endless..... ?Support Group Topics ? ?Teaching Method Utilized:  ?Visual ?Auditory ?Hands on ? ?Demonstrated degree of understanding via:  Teach Back  ? ?Monitoring/Evaluation:  Dietary intake, exercise, and body weight. Follow up in 3 months for 9 month post-op visit.  ?

## 2021-07-23 DIAGNOSIS — D485 Neoplasm of uncertain behavior of skin: Secondary | ICD-10-CM | POA: Diagnosis not present

## 2021-07-23 DIAGNOSIS — L986 Other infiltrative disorders of the skin and subcutaneous tissue: Secondary | ICD-10-CM | POA: Diagnosis not present

## 2021-07-23 DIAGNOSIS — L578 Other skin changes due to chronic exposure to nonionizing radiation: Secondary | ICD-10-CM | POA: Diagnosis not present

## 2021-10-06 ENCOUNTER — Encounter: Payer: Self-pay | Admitting: Skilled Nursing Facility1

## 2021-10-06 ENCOUNTER — Encounter: Payer: BC Managed Care – PPO | Attending: General Surgery | Admitting: Skilled Nursing Facility1

## 2021-10-06 DIAGNOSIS — Z713 Dietary counseling and surveillance: Secondary | ICD-10-CM | POA: Insufficient documentation

## 2021-10-06 DIAGNOSIS — Z6832 Body mass index (BMI) 32.0-32.9, adult: Secondary | ICD-10-CM | POA: Diagnosis not present

## 2021-10-06 DIAGNOSIS — E782 Mixed hyperlipidemia: Secondary | ICD-10-CM | POA: Insufficient documentation

## 2021-10-06 DIAGNOSIS — E669 Obesity, unspecified: Secondary | ICD-10-CM

## 2021-10-06 NOTE — Progress Notes (Signed)
Bariatric Nutrition Follow-Up Visit Medical Nutrition Therapy   NUTRITION ASSESSMENT  Surgery date: 02/18/2021 Surgery type: RYGB Start weight at [facility]: 278 Weight today: 202 pounds   Body Composition Scale 03/04/2021 04/16/2021 07/16/2021 10/06/2021  Current Body Weight 253.8 236.1 213.3 202  Total Body Fat % 43.8 41.4 38.5 36.8  Visceral Fat 12 10 9 8   Fat-Free Mass % 56.1 58.5 61.4 63.1   Total Body Water % 42.5 43.7 45.2 46  Muscle-Mass lbs 34.1 34.5 34.2 34.1  BMI 41.9 38 34.3 32.5  Body Fat Displacement             Torso  lbs 69 60.6 50.8 46         Left Leg  lbs 13.8 12.1 10.1 9.2         Right Leg  lbs 13.8 12.1 10.1 9.2         Left Arm  lbs 6.9 6.0 5.0 4.6         Right Arm   lbs 6.9 6.0 5.0 4.6   Clinical  Medical hx: endometriosis, kidney stone Medications: N/A  Labs:  Notable signs/symptoms: N/A Any previous deficiencies? No   Lifestyle & Dietary Hx  Pt states she notices when she does not drink enough water she is more constipated.  Pt states work has catered lunch every day.  Pt states she keeps a garden.  Pt states sometimes on the weekend she does not have lunch.  Pt states she has sedentary work but walks on lunch.  Pt states she has had a menstrual cycle every other week stating she has spoken with her gynecologist and has an IUD implant. .   Estimated daily fluid intake: 60-70 oz Estimated daily protein intake: 60+ g Supplements: multi and calcium Current average weekly physical activity: gym 6 days a week 60 minutes and walking at work  24-Hr Dietary Recall First Meal 7:30-8: coffee Snack 9:  yogurt or cottage cheese Second Meal: grilled chicken or shrimp or chicken and beans or salad and small piece of baked potato and salad Snack 3 :  cottage cheese or yogurt: sometimes nuts later on Third Meal: chicken or fish or shrimp or pork chop + zucchini and squash Snack:  Beverages: water (sometimes with cucumber or strawberries), some coffee + almond  milk or protein shake  Post-Op Goals/ Signs/ Symptoms Using straws: no Drinking while eating: no Chewing/swallowing difficulties: no Changes in vision: no Changes to mood/headaches: no Hair loss/changes to skin/nails: no Difficulty focusing/concentrating: no Sweating: no Limb weakness: no Dizziness/lightheadedness: no Palpitations: no  Carbonated/caffeinated beverages: no N/V/D/C/Gas: no Abdominal pain: no Dumping syndrome: no    NUTRITION DIAGNOSIS  Overweight/obesity ([place]-3.3) related to past poor dietary habits and physical inactivity as evidenced by completed bariatric surgery and following dietary guidelines for continued weight loss and healthy nutrition status.     NUTRITION INTERVENTION Nutrition counseling (C-1) and education (E-2) to facilitate bariatric surgery goals, including: The importance of consuming adequate calories as well as certain nutrients daily due to the body's need for essential vitamins, minerals, and fats The importance of daily physical activity and to reach a goal of at least 150 minutes of moderate to vigorous physical activity weekly (or as directed by their physician) due to benefits such as increased musculature and improved lab values The importance of intuitive eating specifically learning hunger-satiety cues and understanding the importance of learning a new body: The importance of mindful eating to avoid grazing behaviors  Encouraged patient to honor their  body's internal hunger and fullness cues.  Throughout the day, check in mentally and rate hunger. Stop eating when satisfied not full regardless of how much food is left on the plate.  Get more if still hungry 20-30 minutes later.  The key is to honor satisfaction so throughout the meal, rate fullness factor and stop when comfortably satisfied not physically full. The key is to honor hunger and fullness without any feelings of guilt or shame.  Pay attention to what the internal cues are, rather  than any external factors. This will enhance the confidence you have in listening to your own body and following those internal cues enabling you to increase how often you eat when you are hungry not out of appetite and stop when you are satisfied not full.  Encouraged pt to continue to eat balanced meals inclusive of non starchy vegetables 2 times a day 7 days a week Encouraged pt to choose lean protein sources: limiting beef, pork, sausage, hotdogs, and lunch meat Encourage pt to choose healthy fats such as plant based limiting animal fats Encouraged pt to continue to drink a minium 64 fluid ounces with half being plain water to satisfy proper hydration    Handouts Provided Include  Phase 7  Learning Style & Readiness for Change Teaching method utilized: Visual & Auditory  Demonstrated degree of understanding via: Teach Back  Readiness Level: action Barriers to learning/adherence to lifestyle change: none identified   RD's Notes for Next Visit Assess adherence to pt chosen goals    MONITORING & EVALUATION Dietary intake, weekly physical activity, body weight  Next Steps Patient is to follow-up in November

## 2021-12-02 DIAGNOSIS — Z01419 Encounter for gynecological examination (general) (routine) without abnormal findings: Secondary | ICD-10-CM | POA: Diagnosis not present

## 2021-12-02 DIAGNOSIS — Z124 Encounter for screening for malignant neoplasm of cervix: Secondary | ICD-10-CM | POA: Diagnosis not present

## 2022-01-05 DIAGNOSIS — Z30432 Encounter for removal of intrauterine contraceptive device: Secondary | ICD-10-CM | POA: Diagnosis not present

## 2022-01-21 DIAGNOSIS — Z23 Encounter for immunization: Secondary | ICD-10-CM | POA: Diagnosis not present

## 2022-01-22 DIAGNOSIS — J029 Acute pharyngitis, unspecified: Secondary | ICD-10-CM | POA: Diagnosis not present

## 2022-02-16 ENCOUNTER — Ambulatory Visit: Payer: BC Managed Care – PPO | Admitting: Skilled Nursing Facility1

## 2022-03-16 DIAGNOSIS — L7211 Pilar cyst: Secondary | ICD-10-CM | POA: Diagnosis not present

## 2022-06-08 ENCOUNTER — Encounter: Payer: Self-pay | Admitting: Nurse Practitioner

## 2022-06-08 ENCOUNTER — Ambulatory Visit: Payer: BC Managed Care – PPO | Admitting: Nurse Practitioner

## 2022-06-08 VITALS — BP 122/78 | HR 60 | Temp 97.9°F | Resp 16 | Ht 66.0 in | Wt 209.6 lb

## 2022-06-08 DIAGNOSIS — Z6833 Body mass index (BMI) 33.0-33.9, adult: Secondary | ICD-10-CM

## 2022-06-08 DIAGNOSIS — Z7689 Persons encountering health services in other specified circumstances: Secondary | ICD-10-CM

## 2022-06-08 DIAGNOSIS — Z131 Encounter for screening for diabetes mellitus: Secondary | ICD-10-CM | POA: Diagnosis not present

## 2022-06-08 DIAGNOSIS — Z79899 Other long term (current) drug therapy: Secondary | ICD-10-CM

## 2022-06-08 DIAGNOSIS — Z13220 Encounter for screening for lipoid disorders: Secondary | ICD-10-CM | POA: Diagnosis not present

## 2022-06-08 DIAGNOSIS — N200 Calculus of kidney: Secondary | ICD-10-CM | POA: Insufficient documentation

## 2022-06-08 MED ORDER — PHENTERMINE HCL 37.5 MG PO TABS: ORAL_TABLET | ORAL | 2 refills | Status: DC

## 2022-06-08 NOTE — Patient Instructions (Signed)

## 2022-06-08 NOTE — Progress Notes (Signed)
New Patient Office Visit  Crystal Abbott is here to establish care today:  Encounter to establish care Medical Records Requested  BMI 33.0-33.9,adult Start Phentermine as directed. Discussed appropriate BMI Diet modification. Physical activity. Encouraged/praised to build confidence.  - CBC with Differential/Platelet - COMPLETE METABOLIC PANEL WITH GFR - Lipid panel - Hemoglobin A1c  Screening for cholesterol level Discussed lifestyle modifications. Recommended diet heavy in fruits and veggies, omega 3's. Decrease consumption of animal meats, cheeses, and dairy products. Remain active and exercise as tolerated.  - Lipid panel  Screening for diabetes mellitus (DM)  - Hemoglobin A1c  Medication management All medications discussed and reviewed in full. All questions and concerns regarding medications addressed.    - CBC with Differential/Platelet - COMPLETE METABOLIC PANEL WITH GFR - Lipid panel - Hemoglobin A1c  Meds ordered this encounter  Medications   phentermine (ADIPEX-P) 37.5 MG tablet    Sig: Take 1/2 to 1 tablet every Morning for Dieting & Weight Loss    Dispense:  30 tablet    Refill:  2    Order Specific Question:   Supervising Provider    Answer:   Unk Pinto [phone]   The Plan of Care is based on a patient-centered health care approach known as shared decision making - the decisions, tests and treatments allow for patient preferences and values to be balanced with clinical evidence.    Notify office for further evaluation and treatment, questions or concerns if any reported s/s fail to improve.   The patient was advised to call back or seek an in-person evaluation if any symptoms worsen or if the condition fails to improve as anticipated.   Further disposition pending results of labs. Discussed med's effects and SE's.    I discussed the assessment and treatment plan with the patient. The patient was provided an opportunity to ask questions and all were  answered. The patient agreed with the plan and demonstrated an understanding of the instructions.  Discussed med's effects and SE's. Screening labs and tests as requested with regular follow-up as recommended.  I provided 35 minutes of face-to-face time during this encounter including counseling, chart review, and critical decision making was preformed.    Subjective    Patient ID: Crystal Abbott, female    DOB: [birth year]  Age: 32 y.o. MRN: [id number]  CC:  Chief Complaint  Patient presents with   New Patient Establishment    HPI Crystal Abbott presents to establish care  She is married with two children 8 and 6.  Lives in [place].  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  She has a hx of nephrolithiasis with lithotripsy in 2021.  Followed by Urology, now sees PRN.  No recent stones or flares.  She also had a successful gastric bypass in 2022.  She is concerned for weight gain and wishes to get back down to goal weight of 185 lb.  She has taken Phentermine in the past with successful weight loss.  She took this medication prior to gastric bypass.  She denies any SE of CP, heart palpitations, insomnia.   BMI is Body mass index is 33.83 kg/m., she has been working on diet and exercise.  She remains active with her children and focuses on portion control.   Wt Readings from Last 3 Encounters:  06/08/22 209 lb 9.6 oz (95.1 kg)  10/06/21 202 lb (91.6 kg)  07/17/21 213 lb 4.8 oz (96.8 kg)   Her BP is well controlled in office  today.  She is not on BP medications.  BP Readings from Last 3 Encounters:  06/08/22 122/78  02/19/21 129/78  02/06/21 (!) 153/89   She is a patient of [facility].  Her LMP was 05/13/22.  She is thinking of becoming a surrogate.    Outpatient Encounter Medications as of 06/08/2022  Medication Sig   cholecalciferol (VITAMIN D3) 25 MCG (1000 UNIT) tablet Take 1,000 Units by mouth daily.   ondansetron (ZOFRAN-ODT) 4 MG  disintegrating tablet Take 1 tablet (4 mg total) by mouth every 6 (six) hours as needed for nausea or vomiting.   phentermine (ADIPEX-P) 37.5 MG tablet Take 1/2 to 1 tablet every Morning for Dieting & Weight Loss   vitamin B-12 (CYANOCOBALAMIN) 1000 MCG tablet Take 1,000 mcg by mouth daily.   levonorgestrel (MIRENA) 20 MCG/24HR IUD 1 each by Intrauterine route once. (Patient not taking: Reported on 06/08/2022)   pantoprazole (PROTONIX) 40 MG tablet Take 1 tablet (40 mg total) by mouth daily. (Patient not taking: Reported on 06/08/2022)   No facility-administered encounter medications on file as of 06/08/2022.    Past Medical History:  Diagnosis Date   Calculi, ureter    right s/p ESWL [id number]   Complication of anesthesia    woke up during wisdom teeth   History of kidney stones    Hx of chlamydia infection    Hx of endometriosis    Hx of varicella    Urgency of urination    Wears contact lenses     Past Surgical History:  Procedure Laterality Date   CYSTOSCOPY W/ URETERAL STENT PLACEMENT Right 11/17/2019   Procedure: CYSTOSCOPY WITH RETROGRADE PYELOGRAM/URETERAL STENT PLACEMENT;  Surgeon: Raynelle Bring, MD;  Location: [facility];  Service: Urology;  Laterality: Right;   CYSTOSCOPY/URETEROSCOPY/HOLMIUM LASER Right 12/01/2019   Procedure: RIGHT URETEROSCOPY ,  RETROGRADE PYELOGRAM ,STENT REMOVAL , STONE MANIPULATION;  Surgeon: Ardis Hughs, MD;  Location: [facility];  Service: Urology;  Laterality: Right;   EXTRACORPOREAL SHOCK WAVE LITHOTRIPSY Right 11/16/2019   Procedure: EXTRACORPOREAL SHOCK WAVE LITHOTRIPSY (ESWL);  Surgeon: Ardis Hughs, MD;  Location: [facility];  Service: Urology;  Laterality: Right;   GASTRIC BYPASS  2022   GASTRIC ROUX-EN-Y N/A 02/18/2021   Procedure: LAPAROSCOPIC ROUX-EN-Y GASTRIC BYPASS WITH UPPER ENDOSCOPY;  Surgeon: Kinsinger, Arta Bruce, MD;  Location: [facility];  Service: General;  Laterality: N/A;   LITHOTRIPSY      WISDOM TOOTH EXTRACTION      Family History  Problem Relation Age of Onset   Diabetes Mother    Cancer Father    Diabetes Sister    Cancer Maternal Grandmother     Social History   Socioeconomic History   Marital status: Married    Spouse name: Not on file   Number of children: Not on file   Years of education: Not on file   Highest education level: Not on file  Occupational History   Not on file  Tobacco Use   Smoking status: Never   Smokeless tobacco: Never  Vaping Use   Vaping Use: Never used  Substance and Sexual Activity   Alcohol use: Not Currently    Alcohol/week: 0.0 standard drinks of alcohol    Comment: rare   Drug use: Never   Sexual activity: Yes    Birth control/protection: I.U.D.  Other Topics Concern   Not on file  Social History Narrative   Not on file   Social Determinants of Health  Financial Resource Strain: Not on file  Food Insecurity: Not on file  Transportation Needs: Not on file  Physical Activity: Not on file  Stress: Not on file  Social Connections: Not on file  Intimate Partner Violence: Not on file    Review of Systems  Constitutional: Negative.   HENT: Negative.    Eyes: Negative.   Respiratory: Negative.    Cardiovascular: Negative.   Gastrointestinal: Negative.   Genitourinary: Negative.   Musculoskeletal: Negative.   Skin: Negative.   Neurological: Negative.   Endo/Heme/Allergies: Negative.   Psychiatric/Behavioral: Negative.         Objective    BP 122/78   Pulse 60   Temp 97.9 F (36.6 C)   Resp 16   Ht '5\' 6"'$  (1.676 m)   Wt 209 lb 9.6 oz (95.1 kg)   SpO2 99%   BMI 33.83 kg/m   Physical Exam HENT:     Head: Normocephalic and atraumatic.     Nose: Nose normal.  Eyes:     Pupils: Pupils are equal, round, and reactive to light.  Cardiovascular:     Rate and Rhythm: Normal rate and regular rhythm.     Pulses: Normal pulses.     Heart sounds: Normal heart sounds.  Pulmonary:     Effort: Pulmonary  effort is normal.     Breath sounds: Normal breath sounds.  Abdominal:     General: Abdomen is flat. Bowel sounds are normal.     Palpations: Abdomen is soft.  Musculoskeletal:        General: Normal range of motion.     Cervical back: Normal range of motion.  Skin:    General: Skin is warm.  Neurological:     Mental Status: She is alert and oriented to person, place, and time.  Psychiatric:        Mood and Affect: Mood normal.        Behavior: Behavior normal.      Darrol Jump, NP

## 2022-06-09 LAB — COMPLETE METABOLIC PANEL WITH GFR
AG Ratio: 1.6 (calc) (ref 1.0–2.5)
ALT: 11 U/L (ref 6–29)
AST: 9 U/L — ABNORMAL LOW (ref 10–30)
Albumin: 4.3 g/dL (ref 3.6–5.1)
Alkaline phosphatase (APISO): 89 U/L (ref 31–125)
BUN: 14 mg/dL (ref 7–25)
CO2: 24 mmol/L (ref 20–32)
Calcium: 8.9 mg/dL (ref 8.6–10.2)
Chloride: 108 mmol/L (ref 98–110)
Creat: 0.68 mg/dL (ref 0.50–0.97)
Globulin: 2.7 g/dL (calc) (ref 1.9–3.7)
Glucose, Bld: 79 mg/dL (ref 65–99)
Potassium: 4.3 mmol/L (ref 3.5–5.3)
Sodium: 140 mmol/L (ref 135–146)
Total Bilirubin: 0.4 mg/dL (ref 0.2–1.2)
Total Protein: 7 g/dL (ref 6.1–8.1)
eGFR: 119 mL/min/{1.73_m2} (ref >=60)

## 2022-06-09 LAB — CBC WITH DIFFERENTIAL/PLATELET
Absolute Monocytes: 412 cells/uL (ref 200–950)
Basophils Absolute: 12 cells/uL (ref 0–200)
Basophils Relative: 0.2 %
Eosinophils Absolute: 162 cells/uL (ref 15–500)
Eosinophils Relative: 2.8 %
HCT: 40.2 % (ref 35.0–45.0)
Hemoglobin: 13.3 g/dL (ref 11.7–15.5)
Lymphs Abs: 2169 cells/uL (ref 850–3900)
MCH: 29.2 pg (ref 27.0–33.0)
MCHC: 33.1 g/dL (ref 32.0–36.0)
MCV: 88.4 fL (ref 80.0–100.0)
MPV: 11.1 fL (ref 7.5–12.5)
Monocytes Relative: 7.1 %
Neutro Abs: 3045 cells/uL (ref 1500–7800)
Neutrophils Relative %: 52.5 %
Platelets: 239 10*3/uL (ref 140–400)
RBC: 4.55 10*6/uL (ref 3.80–5.10)
RDW: 12.1 % (ref 11.0–15.0)
Total Lymphocyte: 37.4 %
WBC: 5.8 10*3/uL (ref 3.8–10.8)

## 2022-06-09 LAB — HEMOGLOBIN A1C
Hgb A1c MFr Bld: 5.3 % of total Hgb (ref <5.7)
Mean Plasma Glucose: 105 mg/dL
eAG (mmol/L): 5.8 mmol/L

## 2022-06-09 LAB — LIPID PANEL
Cholesterol: 173 mg/dL (ref <200)
HDL: 58 mg/dL (ref >=50)
LDL Cholesterol (Calc): 95 mg/dL (calc)
Non-HDL Cholesterol (Calc): 115 mg/dL (calc) (ref <130)
Total CHOL/HDL Ratio: 3 (calc) (ref <5.0)
Triglycerides: 102 mg/dL (ref <150)

## 2022-08-06 DIAGNOSIS — N840 Polyp of corpus uteri: Secondary | ICD-10-CM | POA: Diagnosis not present

## 2022-08-06 DIAGNOSIS — Z6833 Body mass index (BMI) 33.0-33.9, adult: Secondary | ICD-10-CM | POA: Diagnosis not present

## 2022-08-14 DIAGNOSIS — N840 Polyp of corpus uteri: Secondary | ICD-10-CM | POA: Diagnosis not present

## 2022-09-03 DIAGNOSIS — K9189 Other postprocedural complications and disorders of digestive system: Secondary | ICD-10-CM | POA: Diagnosis not present

## 2022-09-03 DIAGNOSIS — Z3202 Encounter for pregnancy test, result negative: Secondary | ICD-10-CM | POA: Diagnosis not present

## 2022-10-05 ENCOUNTER — Encounter (HOSPITAL_COMMUNITY): Payer: Self-pay | Admitting: *Deleted

## 2022-12-10 ENCOUNTER — Encounter: Payer: BC Managed Care – PPO | Admitting: Nurse Practitioner

## 2023-01-05 ENCOUNTER — Encounter: Payer: Self-pay | Admitting: Nurse Practitioner

## 2023-01-05 ENCOUNTER — Ambulatory Visit (INDEPENDENT_AMBULATORY_CARE_PROVIDER_SITE_OTHER): Payer: BC Managed Care – PPO | Admitting: Nurse Practitioner

## 2023-01-05 VITALS — BP 98/64 | HR 64 | Temp 98.2°F | Ht 66.0 in | Wt 195.0 lb

## 2023-01-05 DIAGNOSIS — Z1389 Encounter for screening for other disorder: Secondary | ICD-10-CM | POA: Diagnosis not present

## 2023-01-05 DIAGNOSIS — E538 Deficiency of other specified B group vitamins: Secondary | ICD-10-CM

## 2023-01-05 DIAGNOSIS — Z23 Encounter for immunization: Secondary | ICD-10-CM | POA: Diagnosis not present

## 2023-01-05 DIAGNOSIS — Z131 Encounter for screening for diabetes mellitus: Secondary | ICD-10-CM | POA: Diagnosis not present

## 2023-01-05 DIAGNOSIS — Z130 Encounter for screening for diseases of the blood and blood-forming organs and certain disorders involving the immune mechanism: Secondary | ICD-10-CM | POA: Diagnosis not present

## 2023-01-05 DIAGNOSIS — Z0000 Encounter for general adult medical examination without abnormal findings: Secondary | ICD-10-CM

## 2023-01-05 DIAGNOSIS — E559 Vitamin D deficiency, unspecified: Secondary | ICD-10-CM

## 2023-01-05 DIAGNOSIS — Z6831 Body mass index (BMI) 31.0-31.9, adult: Secondary | ICD-10-CM

## 2023-01-05 DIAGNOSIS — Z13220 Encounter for screening for lipoid disorders: Secondary | ICD-10-CM

## 2023-01-05 DIAGNOSIS — Z79899 Other long term (current) drug therapy: Secondary | ICD-10-CM

## 2023-01-05 DIAGNOSIS — Z0001 Encounter for general adult medical examination with abnormal findings: Secondary | ICD-10-CM

## 2023-01-05 DIAGNOSIS — Z1329 Encounter for screening for other suspected endocrine disorder: Secondary | ICD-10-CM

## 2023-01-05 DIAGNOSIS — N200 Calculus of kidney: Secondary | ICD-10-CM

## 2023-01-05 NOTE — Patient Instructions (Signed)

## 2023-01-05 NOTE — Progress Notes (Signed)
Complete Physical  Assessment and Plan:  Encounter for general adult medical examination with abnormal findings Due annually  Health maintenance reviewed Healthily lifestyle goals set  - CBC with Differential/Platelet - COMPLETE METABOLIC PANEL WITH GFR - Lipid panel - TSH - Hemoglobin A1c - Insulin, random - VITAMIN D 25 Hydroxy (Vit-D Deficiency, Fractures) - Urinalysis, Routine w reflex microscopic - Microalbumin / creatinine urine ratio - Vitamin B12  BMI 31.0-31.9,adult Discussed appropriate BMI Diet modification. Physical activity. Encouraged/praised to build confidence.  Nephrolithiasis No recent flare Established with [facility]  - COMPLETE METABOLIC PANEL WITH GFR  Screening for cholesterol level  - Lipid panel  Screening for diabetes mellitus (DM)  - Hemoglobin A1c - Insulin, random  Screening for thyroid disorder  - TSH  Screening for hematuria or proteinuria  - Urinalysis, Routine w reflex microscopic - Microalbumin / creatinine urine ratio  Vitamin D deficiency Continue supplement for goal of 60-100 Monitor Vitamin D levels  - VITAMIN D 25 Hydroxy (Vit-D Deficiency, Fractures)  Medication management All medications discussed and reviewed in full. All questions and concerns regarding medications addressed.    - CBC with Differential/Platelet - COMPLETE METABOLIC PANEL WITH GFR - Lipid panel - TSH - Hemoglobin A1c - Insulin, random - VITAMIN D 25 Hydroxy (Vit-D Deficiency, Fractures) - Urinalysis, Routine w reflex microscopic - Microalbumin / creatinine urine ratio - Vitamin B12  B12 deficiency Continue supplement  - Vitamin B12  Need for immunization against influenza Administered - patient tolerated well.  - Fluzone Trivalent Flu Vaccine (Muli dose preparattion)    Notify office for further evaluation and treatment, questions or concerns if any reported s/s fail to improve.   The patient was advised to call back  or seek an in-person evaluation if any symptoms worsen or if the condition fails to improve as anticipated.   Further disposition pending results of labs. Discussed med's effects and SE's.    I discussed the assessment and treatment plan with the patient. The patient was provided an opportunity to ask questions and all were answered. The patient agreed with the plan and demonstrated an understanding of the instructions.  Discussed med's effects and SE's. Screening labs and tests as requested with regular follow-up as recommended.  I provided 40 minutes of face-to-face time during this encounter including counseling, chart review, and critical decision making was preformed.  Today's Plan of Care is based on a patient-centered health care approach known as shared decision making - the decisions, tests and treatments allow for patient preferences and values to be balanced with clinical evidence.     Future Appointments  Date Time Provider Department Center  01/05/2024 10:00 AM Adela Glimpse, NP [facility] None    HPI  32 y.o. female  presents for a complete physical. She has Pregnancy; Postpartum state; Morbid obesity (HCC); and Nephrolithiasis on their problem list.  She is married with two children 9 and 5.  Lives in [place].  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  Overall she reports feeling well today.    She is considering becoming  a surrogate.  Currently following closely with family and GYN to prepare.  Plans to have transfer 04/2023.   She has a hx of nephrolithiasis with lithotripsy in 2021.  Followed by [facility], now sees PRN.  No recent stones or flares.   She also had a successful gastric bypass in 2022.  She was concerned for weight gain restarted Phentermine.  Goal weight is 185 lb.  She  has not been taking regularly feeling as though she is doing well with lifestyle  modifications. .  She has taken Phentermine in the past with successful weight  loss.  She took this medication prior to gastric bypass.  She denies any SE of CP, heart palpitations, insomnia.    BMI is Body mass index is 31.47 kg/m., she has been working on diet and exercise. Wt Readings from Last 3 Encounters:  01/05/23 195 lb (88.5 kg)  06/08/22 209 lb 9.6 oz (95.1 kg)  10/06/21 202 lb (91.6 kg)   Her blood pressure has been controlled at home, today their BP is BP: 98/64 She does workout. She denies chest pain, shortness of breath, dizziness.   She is not on cholesterol medication. Her cholesterol is at goal. The cholesterol last visit was:   Lab Results  Component Value Date   CHOL 173 06/08/2022   HDL 58 06/08/2022   LDLCALC 95 06/08/2022   TRIG 102 06/08/2022   CHOLHDL 3.0 06/08/2022   She has been working on diet and exercise for prediabetes, she is not on bASA, she is not on ACE/ARB and denies polydipsia and polyuria. Last A1C in the office was:  Lab Results  Component Value Date   HGBA1C 5.3 06/08/2022   Last GFR: Lab Results  Component Value Date   EGFR 119 06/08/2022   Patient is on Vitamin D supplement.   No results found for: "VD25OH"    Current Medications:  Current Outpatient Medications on File Prior to Visit  Medication Sig Dispense Refill   cholecalciferol (VITAMIN D3) 25 MCG (1000 UNIT) tablet Take 1,000 Units by mouth daily.     vitamin B-12 (CYANOCOBALAMIN) 1000 MCG tablet Take 1,000 mcg by mouth daily.     levonorgestrel (MIRENA) 20 MCG/24HR IUD 1 each by Intrauterine route once. (Patient not taking: Reported on 06/08/2022)     ondansetron (ZOFRAN-ODT) 4 MG disintegrating tablet Take 1 tablet (4 mg total) by mouth every 6 (six) hours as needed for nausea or vomiting. (Patient not taking: Reported on 01/05/2023) 20 tablet 0   pantoprazole (PROTONIX) 40 MG tablet Take 1 tablet (40 mg total) by mouth daily. (Patient not taking: Reported on 06/08/2022) 90 tablet 0   phentermine (ADIPEX-P) 37.5 MG tablet Take 1/2 to 1 tablet every Morning  for Dieting & Weight Loss (Patient not taking: Reported on 01/05/2023) 30 tablet 2   No current facility-administered medications on file prior to visit.   Allergies:  Allergies  Allergen Reactions   Penicillins Rash    Has patient had a PCN reaction causing immediate rash, facial/tongue/throat swelling, SOB or lightheadedness with hypotension: Yes Has patient had a PCN reaction causing severe rash involving mucus membranes or skin necrosis: Yes Has patient had a PCN reaction that required hospitalization No Has patient had a PCN reaction occurring within the last 10 years: No If all of the above answers are "NO", then may proceed with Cephalosporin use.    Medical History:  She has Pregnancy; Postpartum state; Morbid obesity (HCC); and Nephrolithiasis on their problem list.  Health Maintenance:   Immunization History  Administered Date(s) Administered   Tdap 05/22/2015   Health Maintenance  Topic Date Due   COVID-19 Vaccine (1) Never done   Hepatitis C Screening  Never done   Cervical Cancer Screening (HPV/Pap Cotest)  Never done   INFLUENZA VACCINE  11/05/2022   DTaP/Tdap/Td (2 - Td or Tdap) 05/21/2025   HIV Screening  Completed   HPV VACCINES  Aged Out    LMP: 01/04/23 Sexually Active: yes STD testing offered - declines  Pap: UTD with GYN MGM: Due at age 18 2033 DEXA: N/A  Colonoscopy: N/A EGD: N/A   Last Dental Exam: Goes Every 6 mo [facility] [place] Last Eye Exam: Gets eye exam through telehealth yearly - no corrective lenses  Last Derm Exam: Yearly - [facility]   Patient Care Team: Lucky Cowboy, MD as PCP - General (Internal Medicine)  Surgical History:  She has a past surgical history that includes Wisdom tooth extraction; Extracorporeal shock wave lithotripsy (Right, 11/16/2019); Cystoscopy w/ ureteral stent placement (Right, 11/17/2019); Cystoscopy/ureteroscopy/holmium laser (Right, 12/01/2019); Gastric Roux-En-Y  (N/A, 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 Family History:  Herfamily history includes Cancer in her father and maternal grandmother; Diabetes in her mother and sister. Social History:  She reports that she has never smoked. She has never used smokeless tobacco. She reports that she does not currently use alcohol. She reports that she does not use drugs.  Review of Systems: Review of Systems  Constitutional: Negative.   HENT: Negative.    Eyes: Negative.   Respiratory: Negative.    Cardiovascular: Negative.   Gastrointestinal: Negative.   Genitourinary: Negative.   Musculoskeletal: Negative.   Skin: Negative.   Neurological: Negative.   Endo/Heme/Allergies: Negative.   Psychiatric/Behavioral: Negative.      Physical Exam: Estimated body mass index is 31.47 kg/m as calculated from the following:   Height as of this encounter: 5\' 6"  (1.676 m).   Weight as of this encounter: 195 lb (88.5 kg). BP 98/64   Pulse 64   Temp 98.2 F (36.8 C)   Ht 5\' 6"  (1.676 m)   Wt 195 lb (88.5 kg)   SpO2 99%   BMI 31.47 kg/m   General Appearance: Well nourished, well developed, in no apparent distress.  Eyes: PERRLA, EOMs, conjunctiva no swelling or erythema, normal fundi and vessels.  Sinuses: No Frontal/maxillary tenderness  ENT/Mouth: Ext aud canals clear, normal light reflex with TMs without erythema, bulging. Good dentition. No erythema, swelling, or exudate on post pharynx. Tonsils not swollen or erythematous. Hearing normal.  Neck: Supple, thyroid normal. No bruits  Respiratory: Respiratory effort normal, BS equal bilaterally without rales, rhonchi, wheezing or stridor.  Cardio: RRR without murmurs, rubs or gallops. Brisk peripheral pulses without edema.  Chest: symmetric, with normal excursions and percussion.  Breasts: Symmetric, without lumps, nipple discharge, retractions.  Abdomen: Soft, nontender, no guarding, rebound, hernias, masses, or organomegaly.   Lymphatics: Non tender without lymphadenopathy.  Musculoskeletal: Full ROM all peripheral extremities,5/5 strength, and normal gait.  Skin: Warm, dry without rashes, lesions, ecchymosis. Neuro: Cranial nerves intact, reflexes equal bilaterally. Normal muscle tone, no cerebellar symptoms. Sensation intact.  Psych: Awake and oriented X 3, normal affect, Insight and Judgment appropriate.  Genitourinary: Female genitalia: not done  EKG: Defer   Adela Glimpse, NP 10:11 AM [facility]

## 2023-01-06 LAB — URINALYSIS, ROUTINE W REFLEX MICROSCOPIC
Bilirubin Urine: NEGATIVE
Glucose, UA: NEGATIVE
Hyaline Cast: NONE SEEN /[LPF]
Ketones, ur: NEGATIVE
Leukocytes,Ua: NEGATIVE
Nitrite: NEGATIVE
Protein, ur: NEGATIVE
Specific Gravity, Urine: 1.026 (ref 1.001–1.035)
pH: 7 (ref 5.0–8.0)

## 2023-01-06 LAB — LIPID PANEL
Cholesterol: 177 mg/dL (ref <200)
HDL: 61 mg/dL (ref >=50)
LDL Cholesterol (Calc): 94 mg/dL
Non-HDL Cholesterol (Calc): 116 mg/dL (ref <130)
Total CHOL/HDL Ratio: 2.9 (calc) (ref <5.0)
Triglycerides: 128 mg/dL (ref <150)

## 2023-01-06 LAB — CBC WITH DIFFERENTIAL/PLATELET
Absolute Monocytes: 456 {cells}/uL (ref 200–950)
Basophils Absolute: 20 {cells}/uL (ref 0–200)
Basophils Relative: 0.3 %
Eosinophils Absolute: 409 {cells}/uL (ref 15–500)
Eosinophils Relative: 6.1 %
HCT: 41.7 % (ref 35.0–45.0)
Hemoglobin: 13.7 g/dL (ref 11.7–15.5)
Lymphs Abs: 2271 {cells}/uL (ref 850–3900)
MCH: 29.4 pg (ref 27.0–33.0)
MCHC: 32.9 g/dL (ref 32.0–36.0)
MCV: 89.5 fL (ref 80.0–100.0)
MPV: 11.3 fL (ref 7.5–12.5)
Monocytes Relative: 6.8 %
Neutro Abs: 3544 {cells}/uL (ref 1500–7800)
Neutrophils Relative %: 52.9 %
Platelets: 250 10*3/uL (ref 140–400)
RBC: 4.66 10*6/uL (ref 3.80–5.10)
RDW: 12 % (ref 11.0–15.0)
Total Lymphocyte: 33.9 %
WBC: 6.7 10*3/uL (ref 3.8–10.8)

## 2023-01-06 LAB — COMPLETE METABOLIC PANEL WITH GFR
AG Ratio: 1.6 (calc) (ref 1.0–2.5)
ALT: 11 U/L (ref 6–29)
AST: 10 U/L (ref 10–30)
Albumin: 4.5 g/dL (ref 3.6–5.1)
Alkaline phosphatase (APISO): 85 U/L (ref 31–125)
BUN: 15 mg/dL (ref 7–25)
CO2: 27 mmol/L (ref 20–32)
Calcium: 9.6 mg/dL (ref 8.6–10.2)
Chloride: 105 mmol/L (ref 98–110)
Creat: 0.61 mg/dL (ref 0.50–0.97)
Globulin: 2.9 g/dL (ref 1.9–3.7)
Glucose, Bld: 81 mg/dL (ref 65–99)
Potassium: 4.4 mmol/L (ref 3.5–5.3)
Sodium: 140 mmol/L (ref 135–146)
Total Bilirubin: 0.3 mg/dL (ref 0.2–1.2)
Total Protein: 7.4 g/dL (ref 6.1–8.1)
eGFR: 123 mL/min/{1.73_m2} (ref >=60)

## 2023-01-06 LAB — VITAMIN B12: Vitamin B-12: 336 pg/mL (ref 200–1100)

## 2023-01-06 LAB — HEMOGLOBIN A1C
Hgb A1c MFr Bld: 5.3 %{Hb} (ref <5.7)
Mean Plasma Glucose: 105 mg/dL
eAG (mmol/L): 5.8 mmol/L

## 2023-01-06 LAB — MICROALBUMIN / CREATININE URINE RATIO
Creatinine, Urine: 157 mg/dL (ref 20–275)
Microalb Creat Ratio: 8 mg/g{creat} (ref <30)
Microalb, Ur: 1.3 mg/dL

## 2023-01-06 LAB — MICROSCOPIC MESSAGE

## 2023-01-06 LAB — INSULIN, RANDOM: Insulin: 4.2 u[IU]/mL

## 2023-01-06 LAB — TSH: TSH: 1.87 m[IU]/L

## 2023-01-06 LAB — VITAMIN D 25 HYDROXY (VIT D DEFICIENCY, FRACTURES): Vit D, 25-Hydroxy: 19 ng/mL — ABNORMAL LOW (ref 30–100)

## 2023-04-07 NOTE — L&D Delivery Note (Signed)
 Delivery Note Went in to check on pt and she reported feeling increased vaginal pressure. On exam vertex at perineum.  As soon as her legs were placed in stirrup baby begun to spontaneously deliver. With one push at 6:04 PM a viable female was delivered via Vaginal, Spontaneous (Presentation: Left Occiput Anterior).  APGAR: 9,9 ; weight 7lbs 5oz .   Placenta status: Spontaneous, Intact.Schultz  Cord: 3vc  with the following complications: none  .  Cord pH: n/a  Anesthesia: Epidural Episiotomy: None Lacerations: None Suture Repair: n/a Est. Blood Loss (mL): 100  Mom to postpartum.  Baby to Couplet care / Skin to Skin with biological parents .  Ted LELON Solo 01/04/2024, 6:17 PM

## 2023-05-31 DIAGNOSIS — Z113 Encounter for screening for infections with a predominantly sexual mode of transmission: Secondary | ICD-10-CM | POA: Diagnosis not present

## 2023-05-31 DIAGNOSIS — O09811 Supervision of pregnancy resulting from assisted reproductive technology, first trimester: Secondary | ICD-10-CM | POA: Diagnosis not present

## 2023-05-31 DIAGNOSIS — Z3480 Encounter for supervision of other normal pregnancy, unspecified trimester: Secondary | ICD-10-CM | POA: Diagnosis not present

## 2023-05-31 LAB — OB RESULTS CONSOLE GC/CHLAMYDIA
Chlamydia: NEGATIVE
Neisseria Gonorrhea: NEGATIVE

## 2023-06-21 DIAGNOSIS — Z369 Encounter for antenatal screening, unspecified: Secondary | ICD-10-CM | POA: Diagnosis not present

## 2023-06-21 DIAGNOSIS — Z3480 Encounter for supervision of other normal pregnancy, unspecified trimester: Secondary | ICD-10-CM | POA: Diagnosis not present

## 2023-06-21 DIAGNOSIS — Z3481 Encounter for supervision of other normal pregnancy, first trimester: Secondary | ICD-10-CM | POA: Diagnosis not present

## 2023-06-21 LAB — OB RESULTS CONSOLE RUBELLA ANTIBODY, IGM: Rubella: IMMUNE

## 2023-06-21 LAB — OB RESULTS CONSOLE HEPATITIS B SURFACE ANTIGEN: Hepatitis B Surface Ag: NEGATIVE

## 2023-06-21 LAB — OB RESULTS CONSOLE HIV ANTIBODY (ROUTINE TESTING): HIV: NONREACTIVE

## 2023-06-23 ENCOUNTER — Other Ambulatory Visit: Payer: Self-pay | Admitting: Obstetrics and Gynecology

## 2023-06-23 DIAGNOSIS — Z333 Pregnant state, gestational carrier: Secondary | ICD-10-CM

## 2023-06-28 DIAGNOSIS — R051 Acute cough: Secondary | ICD-10-CM | POA: Diagnosis not present

## 2023-06-28 DIAGNOSIS — R070 Pain in throat: Secondary | ICD-10-CM | POA: Diagnosis not present

## 2023-06-28 DIAGNOSIS — R0982 Postnasal drip: Secondary | ICD-10-CM | POA: Diagnosis not present

## 2023-06-28 DIAGNOSIS — J101 Influenza due to other identified influenza virus with other respiratory manifestations: Secondary | ICD-10-CM | POA: Diagnosis not present

## 2023-07-21 DIAGNOSIS — Z369 Encounter for antenatal screening, unspecified: Secondary | ICD-10-CM | POA: Diagnosis not present

## 2023-07-26 DIAGNOSIS — Z3482 Encounter for supervision of other normal pregnancy, second trimester: Secondary | ICD-10-CM | POA: Diagnosis not present

## 2023-08-03 ENCOUNTER — Other Ambulatory Visit: Payer: Self-pay

## 2023-08-13 ENCOUNTER — Encounter: Payer: Self-pay | Admitting: *Deleted

## 2023-08-13 DIAGNOSIS — O09812 Supervision of pregnancy resulting from assisted reproductive technology, second trimester: Secondary | ICD-10-CM | POA: Insufficient documentation

## 2023-08-13 DIAGNOSIS — O99840 Bariatric surgery status complicating pregnancy, unspecified trimester: Secondary | ICD-10-CM | POA: Insufficient documentation

## 2023-08-18 DIAGNOSIS — Z369 Encounter for antenatal screening, unspecified: Secondary | ICD-10-CM | POA: Diagnosis not present

## 2023-08-20 ENCOUNTER — Ambulatory Visit: Attending: Obstetrics and Gynecology

## 2023-08-20 ENCOUNTER — Other Ambulatory Visit: Payer: Self-pay | Admitting: *Deleted

## 2023-08-20 ENCOUNTER — Ambulatory Visit: Admitting: Obstetrics

## 2023-08-20 VITALS — BP 98/50 | HR 61

## 2023-08-20 DIAGNOSIS — O321XX0 Maternal care for breech presentation, not applicable or unspecified: Secondary | ICD-10-CM | POA: Diagnosis not present

## 2023-08-20 DIAGNOSIS — O09812 Supervision of pregnancy resulting from assisted reproductive technology, second trimester: Secondary | ICD-10-CM

## 2023-08-20 DIAGNOSIS — O99212 Obesity complicating pregnancy, second trimester: Secondary | ICD-10-CM | POA: Diagnosis not present

## 2023-08-20 DIAGNOSIS — O99842 Bariatric surgery status complicating pregnancy, second trimester: Secondary | ICD-10-CM | POA: Diagnosis not present

## 2023-08-20 DIAGNOSIS — O99840 Bariatric surgery status complicating pregnancy, unspecified trimester: Secondary | ICD-10-CM

## 2023-08-20 DIAGNOSIS — Z3A19 19 weeks gestation of pregnancy: Secondary | ICD-10-CM | POA: Diagnosis not present

## 2023-08-20 DIAGNOSIS — Z363 Encounter for antenatal screening for malformations: Secondary | ICD-10-CM | POA: Diagnosis not present

## 2023-08-20 DIAGNOSIS — E669 Obesity, unspecified: Secondary | ICD-10-CM

## 2023-08-20 DIAGNOSIS — Z333 Pregnant state, gestational carrier: Secondary | ICD-10-CM | POA: Diagnosis not present

## 2023-08-20 NOTE — Progress Notes (Signed)
 MFM Consult Note  Crystal Abbott is currently at 19 weeks and 2 days.  She was seen due to maternal obesity with a BMI of 34 and history of gastric bypass surgery in November 2022.  She reports that she has lost 120 pounds since her gastric bypass surgery.    This is an IVF pregnancy.  The patient is a surrogate for a couple in [place] .  She denies any significant past medical history and denies any problems in her current pregnancy.    She had a cell free DNA test earlier in her pregnancy which indicated a low risk for trisomy 62, 13, and 13. A female fetus is predicted.  She was informed that the fetal growth and amniotic fluid level were appropriate for her gestational age.   There were no obvious fetal anomalies noted on today's ultrasound exam.  The patient was informed that anomalies may be missed due to technical limitations. If the fetus is in a suboptimal position or maternal habitus is increased, visualization of the fetus in the maternal uterus may be impaired.  Due to the IVF pregnancy, the patient was offered and declined a fetal echocardiogram with pediatric cardiology.  She was comfortable with the cardiac views we obtained today.  Due to her history of gastric bypass surgery, we will continue to follow her with growth ultrasounds throughout her pregnancy.    A follow-up exam was scheduled in 5 weeks to assess the fetal growth.    We will reassess the views of the fetal anatomy again at that exam.  The patient stated that all of her questions were answered today.  A total of 30 minutes was spent counseling and coordinating the care for this patient.  Greater than 50% of the time was spent in direct face-to-face contact.

## 2023-09-16 DIAGNOSIS — Z369 Encounter for antenatal screening, unspecified: Secondary | ICD-10-CM | POA: Diagnosis not present

## 2023-09-19 IMAGING — CR DG CHEST 2V
2 series · 2 of 2 positions shown · non-contrast
Comparison: None.

CLINICAL DATA: Preop exam

EXAM:
CHEST - 2 VIEW

[w chest pa]
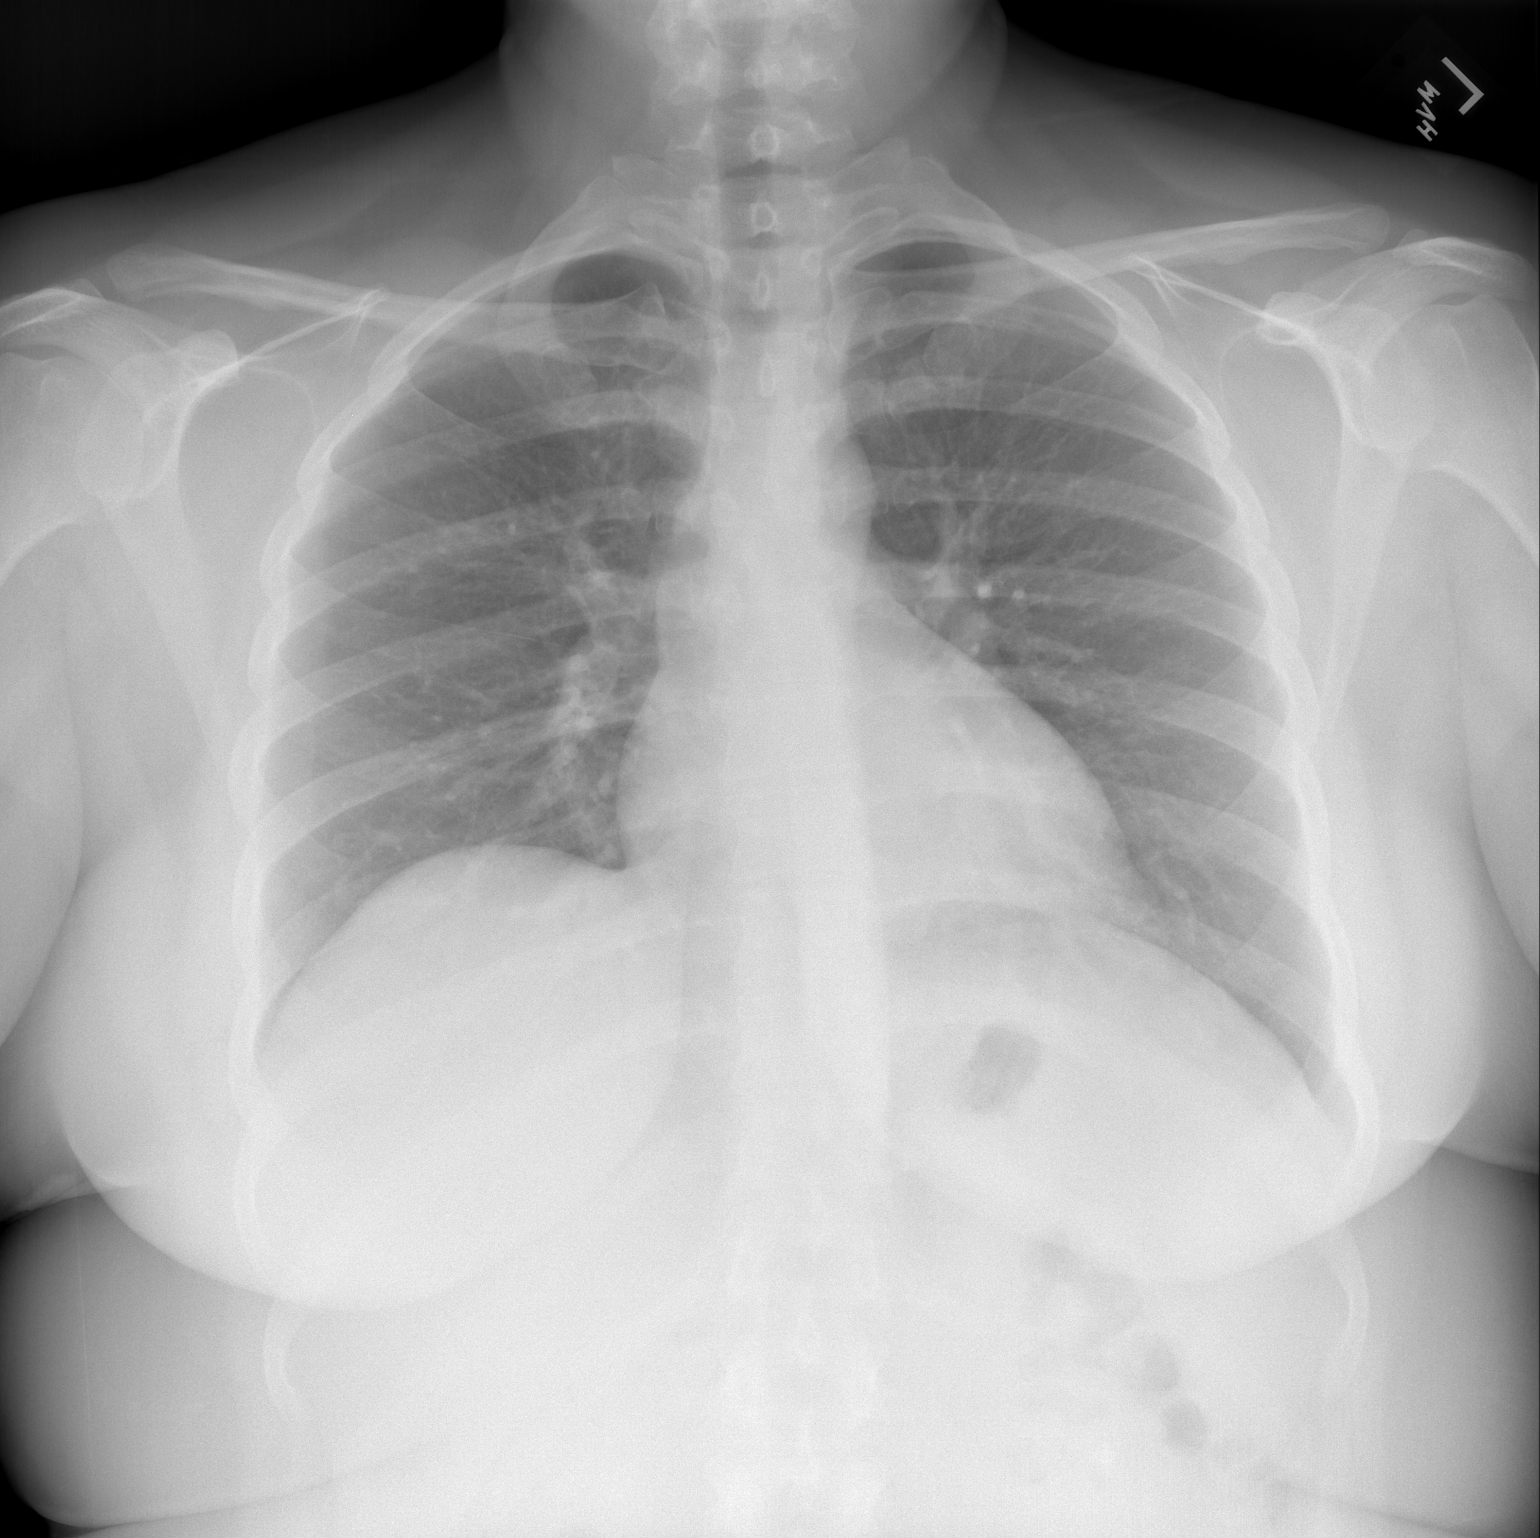

[w chest lat]
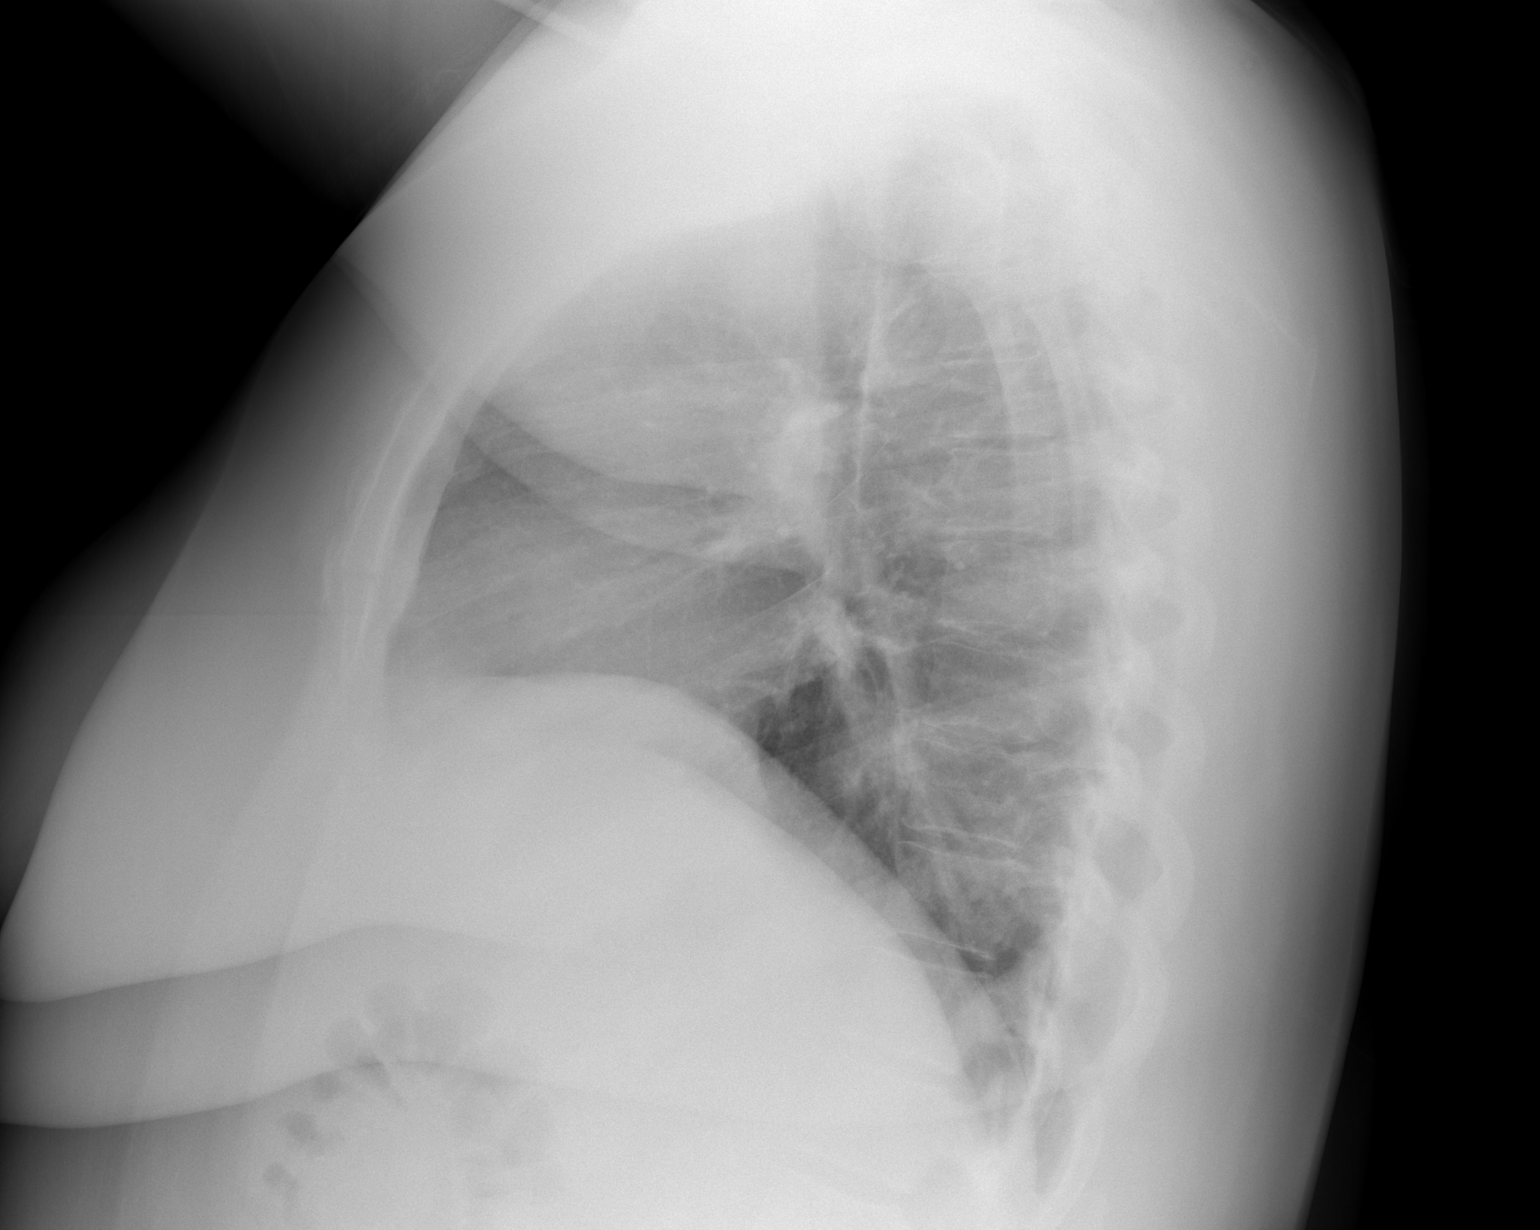

[2 of 2 positions shown; findings below may reference images not displayed]

FINDINGS: The cardiomediastinal silhouette is within normal limits. No pleural
effusion. No pneumothorax. No mass or consolidation. No acute
osseous abnormality.
IMPRESSION: No acute findings in the chest.

## 2023-09-22 ENCOUNTER — Encounter (HOSPITAL_COMMUNITY): Payer: Self-pay | Admitting: *Deleted

## 2023-09-24 ENCOUNTER — Ambulatory Visit: Attending: Obstetrics

## 2023-09-24 ENCOUNTER — Ambulatory Visit

## 2023-09-24 ENCOUNTER — Other Ambulatory Visit: Payer: Self-pay | Admitting: *Deleted

## 2023-09-24 DIAGNOSIS — O99212 Obesity complicating pregnancy, second trimester: Secondary | ICD-10-CM | POA: Diagnosis not present

## 2023-09-24 DIAGNOSIS — O99840 Bariatric surgery status complicating pregnancy, unspecified trimester: Secondary | ICD-10-CM

## 2023-09-24 DIAGNOSIS — Z3A23 23 weeks gestation of pregnancy: Secondary | ICD-10-CM | POA: Insufficient documentation

## 2023-09-24 DIAGNOSIS — Z362 Encounter for other antenatal screening follow-up: Secondary | ICD-10-CM | POA: Diagnosis not present

## 2023-09-24 DIAGNOSIS — O99842 Bariatric surgery status complicating pregnancy, second trimester: Secondary | ICD-10-CM | POA: Diagnosis not present

## 2023-09-24 DIAGNOSIS — Z333 Pregnant state, gestational carrier: Secondary | ICD-10-CM

## 2023-09-24 DIAGNOSIS — O09812 Supervision of pregnancy resulting from assisted reproductive technology, second trimester: Secondary | ICD-10-CM | POA: Diagnosis not present

## 2023-09-24 DIAGNOSIS — E669 Obesity, unspecified: Secondary | ICD-10-CM

## 2023-09-24 DIAGNOSIS — Z3A24 24 weeks gestation of pregnancy: Secondary | ICD-10-CM

## 2023-10-15 DIAGNOSIS — E559 Vitamin D deficiency, unspecified: Secondary | ICD-10-CM | POA: Diagnosis not present

## 2023-10-15 DIAGNOSIS — Z23 Encounter for immunization: Secondary | ICD-10-CM | POA: Diagnosis not present

## 2023-10-15 DIAGNOSIS — Z3480 Encounter for supervision of other normal pregnancy, unspecified trimester: Secondary | ICD-10-CM | POA: Diagnosis not present

## 2023-10-26 ENCOUNTER — Ambulatory Visit

## 2023-11-04 DIAGNOSIS — Z3A29 29 weeks gestation of pregnancy: Secondary | ICD-10-CM | POA: Diagnosis not present

## 2023-11-04 DIAGNOSIS — O09813 Supervision of pregnancy resulting from assisted reproductive technology, third trimester: Secondary | ICD-10-CM | POA: Diagnosis not present

## 2023-11-18 DIAGNOSIS — Z369 Encounter for antenatal screening, unspecified: Secondary | ICD-10-CM | POA: Diagnosis not present

## 2023-11-19 ENCOUNTER — Ambulatory Visit

## 2023-11-19 ENCOUNTER — Other Ambulatory Visit

## 2023-12-03 DIAGNOSIS — Z333 Pregnant state, gestational carrier: Secondary | ICD-10-CM | POA: Diagnosis not present

## 2023-12-03 DIAGNOSIS — Z3A34 34 weeks gestation of pregnancy: Secondary | ICD-10-CM | POA: Diagnosis not present

## 2023-12-03 DIAGNOSIS — O09813 Supervision of pregnancy resulting from assisted reproductive technology, third trimester: Secondary | ICD-10-CM | POA: Diagnosis not present

## 2023-12-10 ENCOUNTER — Encounter: Payer: BC Managed Care – PPO | Admitting: Nurse Practitioner

## 2023-12-16 DIAGNOSIS — Z369 Encounter for antenatal screening, unspecified: Secondary | ICD-10-CM | POA: Diagnosis not present

## 2023-12-17 ENCOUNTER — Other Ambulatory Visit: Payer: Self-pay

## 2023-12-17 NOTE — Progress Notes (Signed)
 Error

## 2023-12-21 DIAGNOSIS — Z3A36 36 weeks gestation of pregnancy: Secondary | ICD-10-CM | POA: Diagnosis not present

## 2023-12-21 DIAGNOSIS — Z3480 Encounter for supervision of other normal pregnancy, unspecified trimester: Secondary | ICD-10-CM | POA: Diagnosis not present

## 2023-12-21 DIAGNOSIS — O09813 Supervision of pregnancy resulting from assisted reproductive technology, third trimester: Secondary | ICD-10-CM | POA: Diagnosis not present

## 2023-12-21 LAB — OB RESULTS CONSOLE GBS: GBS: NEGATIVE

## 2023-12-27 DIAGNOSIS — Z3A37 37 weeks gestation of pregnancy: Secondary | ICD-10-CM | POA: Diagnosis not present

## 2023-12-27 DIAGNOSIS — O09813 Supervision of pregnancy resulting from assisted reproductive technology, third trimester: Secondary | ICD-10-CM | POA: Diagnosis not present

## 2023-12-29 DIAGNOSIS — Z3A37 37 weeks gestation of pregnancy: Secondary | ICD-10-CM | POA: Diagnosis not present

## 2023-12-29 DIAGNOSIS — Z369 Encounter for antenatal screening, unspecified: Secondary | ICD-10-CM | POA: Diagnosis not present

## 2023-12-29 DIAGNOSIS — O368131 Decreased fetal movements, third trimester, fetus 1: Secondary | ICD-10-CM | POA: Diagnosis not present

## 2024-01-04 ENCOUNTER — Encounter (HOSPITAL_COMMUNITY): Payer: Self-pay | Admitting: Obstetrics and Gynecology

## 2024-01-04 ENCOUNTER — Other Ambulatory Visit: Payer: Self-pay

## 2024-01-04 ENCOUNTER — Inpatient Hospital Stay (HOSPITAL_COMMUNITY)
Admission: AD | Admit: 2024-01-04 | Discharge: 2024-01-05 | DRG: 807 | Disposition: A | Discharge status: Home / Self Care | Attending: Obstetrics and Gynecology | Admitting: Obstetrics and Gynecology

## 2024-01-04 ENCOUNTER — Inpatient Hospital Stay (HOSPITAL_COMMUNITY): Admitting: Anesthesiology

## 2024-01-04 DIAGNOSIS — O26893 Other specified pregnancy related conditions, third trimester: Secondary | ICD-10-CM | POA: Diagnosis not present

## 2024-01-04 DIAGNOSIS — Z3490 Encounter for supervision of normal pregnancy, unspecified, unspecified trimester: Secondary | ICD-10-CM

## 2024-01-04 DIAGNOSIS — Z833 Family history of diabetes mellitus: Secondary | ICD-10-CM | POA: Diagnosis not present

## 2024-01-04 DIAGNOSIS — O99844 Bariatric surgery status complicating childbirth: Principal | ICD-10-CM | POA: Diagnosis present

## 2024-01-04 DIAGNOSIS — Z3A38 38 weeks gestation of pregnancy: Secondary | ICD-10-CM

## 2024-01-04 LAB — CBC
HCT: 37 % (ref 36.0–46.0)
Hemoglobin: 12.5 g/dL (ref 12.0–15.0)
MCH: 29.3 pg (ref 26.0–34.0)
MCHC: 33.8 g/dL (ref 30.0–36.0)
MCV: 86.9 fL (ref 80.0–100.0)
Platelets: 253 K/uL (ref 150–400)
RBC: 4.26 MIL/uL (ref 3.87–5.11)
RDW: 12.9 % (ref 11.5–15.5)
WBC: 11.9 K/uL — ABNORMAL HIGH (ref 4.0–10.5)
nRBC: 0 % (ref 0.0–0.2)

## 2024-01-04 LAB — TYPE AND SCREEN
ABO/RH(D): B POS
Antibody Screen: NEGATIVE

## 2024-01-04 MED ORDER — PHENYLEPHRINE 80 MCG/ML (10ML) SYRINGE FOR IV PUSH (FOR BLOOD PRESSURE SUPPORT)
80.0000 ug | PREFILLED_SYRINGE | INTRAVENOUS | Status: DC | PRN
  Administered 2024-01-04: 80 ug via INTRAVENOUS
  Filled 2024-01-04: qty 10

## 2024-01-04 MED ORDER — TETANUS-DIPHTH-ACELL PERTUSSIS 5-2.5-18.5 LF-MCG/0.5 IM SUSY
0.5000 mL | PREFILLED_SYRINGE | Freq: Once | INTRAMUSCULAR | Status: DC
  Filled 2024-01-04: qty 0.5

## 2024-01-04 MED ORDER — LACTATED RINGERS IV SOLN: INTRAVENOUS | Status: DC

## 2024-01-04 MED ORDER — PRENATAL MULTIVITAMIN CH
1.0000 | ORAL_TABLET | Freq: Every day | ORAL | Status: DC
  Administered 2024-01-05: 1 via ORAL
  Filled 2024-01-04: qty 1

## 2024-01-04 MED ORDER — OXYCODONE-ACETAMINOPHEN 5-325 MG PO TABS: 2.0000 | ORAL_TABLET | ORAL | Status: DC | PRN

## 2024-01-04 MED ORDER — DIPHENHYDRAMINE HCL 50 MG/ML IJ SOLN: 12.5000 mg | INTRAMUSCULAR | Status: DC | PRN

## 2024-01-04 MED ORDER — LIDOCAINE HCL (PF) 1 % IJ SOLN: 30.0000 mL | INTRAMUSCULAR | Status: DC | PRN

## 2024-01-04 MED ORDER — ONDANSETRON HCL 4 MG PO TABS: 4.0000 mg | ORAL_TABLET | ORAL | Status: DC | PRN

## 2024-01-04 MED ORDER — OXYTOCIN BOLUS FROM INFUSION: 333.0000 mL | Freq: Once | INTRAVENOUS | Status: DC

## 2024-01-04 MED ORDER — ONDANSETRON HCL 4 MG/2ML IJ SOLN: 4.0000 mg | Freq: Four times a day (QID) | INTRAMUSCULAR | Status: DC | PRN

## 2024-01-04 MED ORDER — WITCH HAZEL-GLYCERIN EX PADS: 1.0000 | MEDICATED_PAD | CUTANEOUS | Status: DC | PRN

## 2024-01-04 MED ORDER — COCONUT OIL OIL: 1.0000 | TOPICAL_OIL | Status: DC | PRN

## 2024-01-04 MED ORDER — ACETAMINOPHEN 325 MG PO TABS: 650.0000 mg | ORAL_TABLET | ORAL | Status: DC | PRN

## 2024-01-04 MED ORDER — OXYTOCIN-SODIUM CHLORIDE 30-0.9 UT/500ML-% IV SOLN: 2.5000 [IU]/h | INTRAVENOUS | Status: DC

## 2024-01-04 MED ORDER — ONDANSETRON HCL 4 MG/2ML IJ SOLN: 4.0000 mg | INTRAMUSCULAR | Status: DC | PRN

## 2024-01-04 MED ORDER — LACTATED RINGERS IV SOLN
500.0000 mL | Freq: Once | INTRAVENOUS | Status: AC
  Administered 2024-01-04: 500 mL via INTRAVENOUS

## 2024-01-04 MED ORDER — OXYTOCIN-SODIUM CHLORIDE 30-0.9 UT/500ML-% IV SOLN
2.5000 [IU]/h | INTRAVENOUS | Status: DC
Start: 2024-01-04 — End: 2024-01-04

## 2024-01-04 MED ORDER — OXYCODONE HCL 5 MG PO TABS: 5.0000 mg | ORAL_TABLET | ORAL | Status: DC | PRN

## 2024-01-04 MED ORDER — FLEET ENEMA RE ENEM: 1.0000 | ENEMA | RECTAL | Status: DC | PRN

## 2024-01-04 MED ORDER — ZOLPIDEM TARTRATE 5 MG PO TABS: 5.0000 mg | ORAL_TABLET | Freq: Every evening | ORAL | Status: DC | PRN

## 2024-01-04 MED ORDER — FENTANYL-BUPIVACAINE-NACL 0.5-0.125-0.9 MG/250ML-% EP SOLN
12.0000 mL/h | EPIDURAL | Status: DC | PRN
  Administered 2024-01-04: 12 mL/h via EPIDURAL
  Filled 2024-01-04: qty 250

## 2024-01-04 MED ORDER — LACTATED RINGERS IV SOLN
500.0000 mL | INTRAVENOUS | Status: DC | PRN
  Administered 2024-01-04: 500 mL via INTRAVENOUS

## 2024-01-04 MED ORDER — BENZOCAINE-MENTHOL 20-0.5 % EX AERO: 1.0000 | INHALATION_SPRAY | CUTANEOUS | Status: DC | PRN

## 2024-01-04 MED ORDER — LIDOCAINE HCL (PF) 1 % IJ SOLN
INTRAMUSCULAR | Status: DC | PRN
  Administered 2024-01-04 (×2): 4 mL via EPIDURAL

## 2024-01-04 MED ORDER — DIPHENHYDRAMINE HCL 25 MG PO CAPS: 25.0000 mg | ORAL_CAPSULE | Freq: Four times a day (QID) | ORAL | Status: DC | PRN

## 2024-01-04 MED ORDER — DIBUCAINE (PERIANAL) 1 % EX OINT: 1.0000 | TOPICAL_OINTMENT | CUTANEOUS | Status: DC | PRN

## 2024-01-04 MED ORDER — IBUPROFEN 600 MG PO TABS
600.0000 mg | ORAL_TABLET | Freq: Four times a day (QID) | ORAL | Status: DC
  Administered 2024-01-04 – 2024-01-05 (×3): 600 mg via ORAL
  Filled 2024-01-04 (×3): qty 1

## 2024-01-04 MED ORDER — OXYTOCIN-SODIUM CHLORIDE 30-0.9 UT/500ML-% IV SOLN: 2.5000 [IU]/h | INTRAVENOUS | Status: DC | PRN

## 2024-01-04 MED ORDER — EPHEDRINE 5 MG/ML INJ: 10.0000 mg | INTRAVENOUS | Status: DC | PRN

## 2024-01-04 MED ORDER — OXYCODONE-ACETAMINOPHEN 5-325 MG PO TABS: 1.0000 | ORAL_TABLET | ORAL | Status: DC | PRN

## 2024-01-04 MED ORDER — SENNOSIDES-DOCUSATE SODIUM 8.6-50 MG PO TABS
2.0000 | ORAL_TABLET | Freq: Every day | ORAL | Status: DC
  Administered 2024-01-05: 2 via ORAL
  Filled 2024-01-04: qty 2

## 2024-01-04 MED ORDER — SOD CITRATE-CITRIC ACID 500-334 MG/5ML PO SOLN: 30.0000 mL | ORAL | Status: DC | PRN

## 2024-01-04 MED ORDER — OXYTOCIN-SODIUM CHLORIDE 30-0.9 UT/500ML-% IV SOLN
2.5000 [IU]/h | INTRAVENOUS | Status: DC
  Administered 2024-01-04: 2.5 [IU]/h via INTRAVENOUS
  Filled 2024-01-04: qty 500

## 2024-01-04 MED ORDER — OXYCODONE HCL 5 MG PO TABS: 10.0000 mg | ORAL_TABLET | ORAL | Status: DC | PRN

## 2024-01-04 MED ORDER — LACTATED RINGERS IV SOLN: 500.0000 mL | INTRAVENOUS | Status: DC | PRN

## 2024-01-04 MED ORDER — OXYTOCIN BOLUS FROM INFUSION
333.0000 mL | Freq: Once | INTRAVENOUS | Status: AC
  Administered 2024-01-04: 333 mL via INTRAVENOUS

## 2024-01-04 MED ORDER — PHENYLEPHRINE 80 MCG/ML (10ML) SYRINGE FOR IV PUSH (FOR BLOOD PRESSURE SUPPORT): 80.0000 ug | PREFILLED_SYRINGE | INTRAVENOUS | Status: DC | PRN

## 2024-01-04 MED ORDER — TERBUTALINE SULFATE 1 MG/ML IJ SOLN: 0.2500 mg | Freq: Once | INTRAMUSCULAR | Status: DC | PRN

## 2024-01-04 MED ORDER — SIMETHICONE 80 MG PO CHEW: 80.0000 mg | CHEWABLE_TABLET | ORAL | Status: DC | PRN

## 2024-01-04 MED ORDER — FENTANYL CITRATE (PF) 100 MCG/2ML IJ SOLN
100.0000 ug | INTRAMUSCULAR | Status: DC | PRN
  Administered 2024-01-04: 100 ug via INTRAVENOUS
  Filled 2024-01-04: qty 2

## 2024-01-04 NOTE — MAU Note (Signed)
 Crystal Abbott is a 33 y.o. at [redacted]w[redacted]d here in [facility] reporting: started contracting this morning, have gotten closer and stronger. Scheduled for induction on Friday, surrogate and parents will be in town. Was 3/50 last wk. Little bit of bleeding,  bright pinkish this morning. Yesterday was like big globs of mucous and blood. No LOF.  Reports +FM. No problems with preg. Onset of complaint: 0800 Pain score: moderate Vitals:   01/04/24 1132  BP: 119/73  Pulse: 65  Resp: 18  Temp: 98.4 F (36.9 C)  SpO2: 100%     FHT:122 Lab orders placed from triage:

## 2024-01-04 NOTE — Plan of Care (Signed)
  Problem: Education: Goal: Knowledge of Childbirth will improve Outcome: Completed/Met Goal: Ability to make informed decisions regarding treatment and plan of care will improve Outcome: Completed/Met Goal: Ability to state and carry out methods to decrease the pain will improve Outcome: Completed/Met   Problem: Coping: Goal: Ability to verbalize concerns and feelings about labor and delivery will improve Outcome: Completed/Met   Problem: Life Cycle: Goal: Ability to make normal progression through stages of labor will improve Outcome: Completed/Met Goal: Ability to effectively push during vaginal delivery will improve Outcome: Completed/Met   Problem: Safety: Goal: Risk of complications during labor and delivery will decrease Outcome: Completed/Met   Problem: Pain Management: Goal: Relief or control of pain from uterine contractions will improve Outcome: Completed/Met   Problem: Education: Goal: Knowledge of General Education information will improve Description: Including pain rating scale, medication(s)/side effects and non-pharmacologic comfort measures Outcome: Completed/Met   Problem: Health Behavior/Discharge Planning: Goal: Ability to manage health-related needs will improve Outcome: Completed/Met   Problem: Clinical Measurements: Goal: Ability to maintain clinical measurements within normal limits will improve Outcome: Completed/Met Goal: Will remain free from infection Outcome: Completed/Met Goal: Diagnostic test results will improve Outcome: Completed/Met Goal: Respiratory complications will improve Outcome: Completed/Met Goal: Cardiovascular complication will be avoided Outcome: Completed/Met   Problem: Activity: Goal: Risk for activity intolerance will decrease Outcome: Completed/Met   Problem: Nutrition: Goal: Adequate nutrition will be maintained Outcome: Completed/Met   Problem: Coping: Goal: Level of anxiety will decrease Outcome:  Completed/Met   Problem: Elimination: Goal: Will not experience complications related to bowel motility Outcome: Completed/Met Goal: Will not experience complications related to urinary retention Outcome: Completed/Met   Problem: Pain Managment: Goal: General experience of comfort will improve and/or be controlled Outcome: Completed/Met   Problem: Safety: Goal: Ability to remain free from injury will improve Outcome: Completed/Met   Problem: Skin Integrity: Goal: Risk for impaired skin integrity will decrease Outcome: Completed/Met

## 2024-01-04 NOTE — Lactation Note (Signed)
 Lactation Consultation Note  Patient Name: Crystal Abbott Unijb'd Date: 01/04/2024 Age:33 y.o.  Surrogate, LC discussed milk suppression and how to prevent engorgement.    Maternal Data    Feeding    LATCH Score                    Lactation Tools Discussed/Used    Interventions    Discharge    Consult Status      Crystal Abbott Batter 01/04/2024, 11:30 PM

## 2024-01-04 NOTE — Anesthesia Preprocedure Evaluation (Addendum)
 Anesthesia Evaluation  Patient identified by MRN, date of birth, ID band Patient awake    Reviewed: Allergy & Precautions, Patient's Chart, lab work & pertinent test results  History of Anesthesia Complications Negative for: history of anesthetic complications  Airway Mallampati: II  TM Distance: >3 FB Neck ROM: Full    Dental no notable dental hx.    Pulmonary neg pulmonary ROS   Pulmonary exam normal        Cardiovascular negative cardio ROS Normal cardiovascular exam     Neuro/Psych negative neurological ROS     GI/Hepatic negative GI ROS, Neg liver ROS,,,  Endo/Other  negative endocrine ROS    Renal/GU negative Renal ROS     Musculoskeletal negative musculoskeletal ROS (+)    Abdominal   Peds  Hematology negative hematology ROS (+)   Anesthesia Other Findings   Reproductive/Obstetrics (+) Pregnancy                              Anesthesia Physical Anesthesia Plan  ASA: 2  Anesthesia Plan: Epidural   Post-op Pain Management:    Induction:   PONV Risk Score and Plan: Treatment may vary due to age or medical condition  Airway Management Planned: Natural Airway  Additional Equipment: Fetal Monitoring  Intra-op Plan:   Post-operative Plan:   Informed Consent: I have reviewed the patients History and Physical, chart, labs and discussed the procedure including the risks, benefits and alternatives for the proposed anesthesia with the patient or authorized representative who has indicated his/her understanding and acceptance.       Plan Discussed with:   Anesthesia Plan Comments:          Anesthesia Quick Evaluation

## 2024-01-04 NOTE — H&P (Signed)
 Crystal Abbott is a 33 y.o.G3P2002 female who is a surrogate carrier represented to [facility] at 77 4/[redacted]wks gestation with painful contractions.  Pt made cervical change from 4-5 cm while in [facility] thus admitted for labor. Pt is now comfortable with epidural and has no complaints.  - Pregnancy was via embryo transfer ( parents live in [place] )  - Pt with history of gastric bypass  - GBS neg. NIPT low risk   OB History     Gravida  3   Para  2   Term  2   Preterm      AB  0   Living  2      SAB  0   IAB  0   Ectopic  0   Multiple  0   Live Births  2          Past Medical History:  Diagnosis Date   Calculi, ureter    right s/p ESWL 11-16-2019   Complication of anesthesia    woke up during wisdom teeth   History of kidney stones    Hx of chlamydia infection    Hx of endometriosis    Hx of varicella    Postpartum state 08/16/2015   Pregnancy 08/16/2015   Urgency of urination    Wears contact lenses    Past Surgical History:  Procedure Laterality Date   CYSTOSCOPY W/ URETERAL STENT PLACEMENT Right 11/17/2019   Procedure: CYSTOSCOPY WITH RETROGRADE PYELOGRAM/URETERAL STENT PLACEMENT;  Surgeon: Renda Glance, MD;  Location: [facility];  Service: Urology;  Laterality: Right;   CYSTOSCOPY/URETEROSCOPY/HOLMIUM LASER Right 12/01/2019   Procedure: RIGHT URETEROSCOPY ,  RETROGRADE PYELOGRAM ,STENT REMOVAL , STONE MANIPULATION;  Surgeon: Cam Morene ORN, MD;  Location: [facility];  Service: Urology;  Laterality: Right;   EXTRACORPOREAL SHOCK WAVE LITHOTRIPSY Right 11/16/2019   Procedure: EXTRACORPOREAL SHOCK WAVE LITHOTRIPSY (ESWL);  Surgeon: Cam Morene ORN, MD;  Location: [facility];  Service: Urology;  Laterality: Right;   GASTRIC BYPASS  2022   GASTRIC ROUX-EN-Y N/A 02/18/2021   Procedure: LAPAROSCOPIC ROUX-EN-Y GASTRIC BYPASS WITH UPPER ENDOSCOPY;  Surgeon: Stevie, Herlene Righter, MD;  Location: [facility];  Service: General;  Laterality:  N/A;   LITHOTRIPSY     WISDOM TOOTH EXTRACTION     Family History: family history includes Cancer in her father and maternal grandmother; Diabetes in her mother and sister. Social History:  reports that she has never smoked. She has never used smokeless tobacco. She reports that she does not currently use alcohol. She reports that she does not use drugs.     Maternal Diabetes: No Genetic Screening: Normal Maternal Ultrasounds/Referrals: Normal Fetal Ultrasounds or other Referrals:  None Maternal Substance Abuse:  No Significant Maternal Medications:  None Significant Maternal Lab Results:  Group B Strep negative Number of Prenatal Visits:greater than 3 verified prenatal visits Maternal Vaccinations:RSV: Given during pregnancy >/=14 days ago, TDap, and Flu Other Comments:  None  Review of Systems  Constitutional:  Negative for activity change, fatigue and fever.  Eyes:  Negative for photophobia and visual disturbance.  Respiratory:  Negative for chest tightness and shortness of breath.   Cardiovascular:  Negative for chest pain, palpitations and leg swelling.  Gastrointestinal:  Positive for abdominal pain.  Genitourinary:  Positive for pelvic pain.  Musculoskeletal:  Negative for back pain.  Neurological:  Negative for light-headedness and headaches.  Psychiatric/Behavioral:  The patient is not nervous/anxious.    Maternal Medical History:  Reason for admission:  Contractions.   Contractions: Onset was 3-5 hours ago.   Frequency: regular.   Perceived severity is moderate.   Fetal activity: Perceived fetal activity is normal.   Prenatal complications: no prenatal complications Prenatal Complications - Diabetes: none.   Dilation: 5.5 Effacement (%): 90 Station: -2 Exam by:: [employer] RN Blood pressure 105/62, pulse 76, temperature 97.7 F (36.5 C), temperature source Oral, resp. rate 16, height 5' 5 (1.651 m), weight 99 kg, last menstrual period 04/07/2023, SpO2  100%. Maternal Exam:  Uterine Assessment: Contraction strength is moderate.  Contraction frequency is regular.  Abdomen: Patient reports generalized tenderness.  Estimated fetal weight is AGA.   Fetal presentation: vertex Introitus: Normal vulva. Vulva is negative for condylomata and lesion.  Vagina is negative for condylomata.  Amniotic fluid character: meconium stained. Pelvis: adequate for delivery.   Cervix: Cervix evaluated by digital exam.     Fetal Exam Fetal Monitor Review: Baseline rate: 120.  Variability: moderate (6-25 bpm).   Pattern: accelerations present and no decelerations.   Fetal State Assessment: Category I - tracings are normal.   Physical Exam Vitals and nursing note reviewed. Exam conducted with a chaperone present.  Constitutional:      Appearance: Normal appearance. She is obese.  Cardiovascular:     Pulses: Normal pulses.  Pulmonary:     Effort: Pulmonary effort is normal.  Abdominal:     Tenderness: There is generalized abdominal tenderness.  Genitourinary:    General: Normal vulva.  Vulva is no lesion.  Musculoskeletal:        General: Normal range of motion.     Cervical back: Normal range of motion.  Skin:    General: Skin is warm and dry.     Capillary Refill: Capillary refill takes 2 to 3 seconds.  Neurological:     General: No focal deficit present.     Mental Status: She is alert and oriented to person, place, and time. Mental status is at baseline.  Psychiatric:        Mood and Affect: Mood normal.        Behavior: Behavior normal.        Thought Content: Thought content normal.        Judgment: Judgment normal.     Prenatal labs: ABO, Rh: --/--/B POS (09/30 1456) Antibody: NEG (09/30 1456) Rubella: Immune (03/17 0000) RPR:    HBsAg: Negative (03/17 0000)  HIV: Non-reactive (03/17 0000)  GBS: Negative/-- (09/16 0000)   Assessment/Plan: 68bn [id number] female gestational carrier at 70 4/7wks in active labor  - Admitted -  Comfortable with epidural  - GBS neg - AROM with clear but thin mec stained fluid noted - 9.5cm dil  - Anticipate imminent delivery    Ted ORN Panzy Bubeck 01/04/2024, 4:39 PM

## 2024-01-04 NOTE — Anesthesia Procedure Notes (Signed)
 Epidural Patient location during procedure: OB Start time: 01/04/2024 3:40 PM End time: 01/04/2024 3:45 PM  Staffing Anesthesiologist: Paul Lamarr BRAVO, MD Performed: anesthesiologist   Preanesthetic Checklist Completed: patient identified, IV checked, risks and benefits discussed, monitors and equipment checked, pre-op evaluation and timeout performed  Epidural Patient position: sitting Prep: DuraPrep and site prepped and draped Patient monitoring: continuous pulse ox, blood pressure and heart rate Approach: midline Location: L3-L4 Injection technique: LOR air  Needle:  Needle type: Tuohy  Needle gauge: 17 G Needle length: 9 cm Needle insertion depth: 7 cm Catheter type: closed end flexible Catheter size: 19 Gauge Catheter at skin depth: 12 cm Test dose: negative and Other (1% lidocaine )  Assessment Events: blood not aspirated, no cerebrospinal fluid, injection not painful, no injection resistance, no paresthesia and negative IV test  Additional Notes Patient identified. Risks, benefits, and alternatives discussed with patient including but not limited to bleeding, infection, nerve damage, paralysis, failed block, incomplete pain control, headache, blood pressure changes, nausea, vomiting, reactions to medication, itching, and postpartum back pain. Confirmed with bedside nurse the patient's most recent platelet count. Confirmed with patient that they are not currently taking any anticoagulation, have any bleeding history, or any family history of bleeding disorders. Patient expressed understanding and wished to proceed. All questions were answered. Sterile technique was used throughout the entire procedure. Please see nursing notes for vital signs.   Difficult placement, 3 attempts required. Test dose was given through epidural catheter and negative prior to continuing to dose epidural or start infusion. Warning signs of high block given to the patient including shortness of  breath, tingling/numbness in hands, complete motor block, or any concerning symptoms with instructions to call for help. Patient was given instructions on fall risk and not to get out of bed. All questions and concerns addressed with instructions to call with any issues or inadequate analgesia.  Reason for block:procedure for pain

## 2024-01-05 ENCOUNTER — Encounter: Payer: BC Managed Care – PPO | Admitting: Nurse Practitioner

## 2024-01-05 LAB — CBC
HCT: 30.1 % — ABNORMAL LOW (ref 36.0–46.0)
Hemoglobin: 10.3 g/dL — ABNORMAL LOW (ref 12.0–15.0)
MCH: 29.9 pg (ref 26.0–34.0)
MCHC: 34.2 g/dL (ref 30.0–36.0)
MCV: 87.2 fL (ref 80.0–100.0)
Platelets: 169 K/uL (ref 150–400)
RBC: 3.45 MIL/uL — ABNORMAL LOW (ref 3.87–5.11)
RDW: 12.8 % (ref 11.5–15.5)
WBC: 11 K/uL — ABNORMAL HIGH (ref 4.0–10.5)
nRBC: 0 % (ref 0.0–0.2)

## 2024-01-05 LAB — RPR: RPR Ser Ql: NONREACTIVE

## 2024-01-05 MED ORDER — IBUPROFEN 600 MG PO TABS: 600.0000 mg | ORAL_TABLET | Freq: Four times a day (QID) | ORAL | 0 refills | Status: AC

## 2024-01-05 MED ORDER — TETANUS-DIPHTH-ACELL PERTUSSIS 5-2-15.5 LF-MCG/0.5 IM SUSP: 0.5000 mL | Freq: Once | INTRAMUSCULAR | Status: DC

## 2024-01-05 NOTE — Discharge Summary (Signed)
 Postpartum Discharge Summary  Date of Service updated 01/05/24     Patient Name: Crystal Abbott DOB: [birth year] MRN: [id number]  Date of admission: 01/04/2024 Delivery date:01/04/2024 Delivering provider: DELANA BASE [facility] Date of discharge: 01/05/2024  Admitting diagnosis: Normal labor [O80, Z37.9] Intrauterine pregnancy [Z34.90] Intrauterine pregnancy: [redacted]w[redacted]d     Secondary diagnosis:  Principal Problem:   Normal labor Active Problems:   Intrauterine pregnancy  Additional problems: Gestational carrier; pregnancy by IVF    Discharge diagnosis: Term Pregnancy Delivered                                              Post partum procedures:n/a Augmentation: AROM Complications: None  Hospital course: Onset of Labor With Vaginal Delivery      33 y.o. yo G3P3003 at [redacted]w[redacted]d was admitted in Active Labor on 01/04/2024. Labor course was complicated by n/a  Membrane Rupture Time/Date: 4:40 PM,01/04/2024  Delivery Method:Vaginal, Spontaneous Operative Delivery:N/A Episiotomy: None Lacerations:  None Patient had a postpartum course complicated by n/a.  She is ambulating, tolerating a regular diet, passing flatus, and urinating well. Patient is discharged home in stable condition on 01/05/24.  Newborn Data: Birth date:01/04/2024 Birth time:6:04 PM Gender:Female Living status:Living Apgars:8 ,9  Weight:3317 g  Magnesium Sulfate received: No BMZ received: No Rhophylac:N/A MMR:N/A T-DaP:Given prenatally Flu: Yes RSV Vaccine received: Yes Transfusion:No Immunizations administered: Immunization History  Administered Date(s) Administered   Fluzone  Influenza virus vaccine,trivalent (IIV3), split virus 01/05/2023   Tdap 05/22/2015    Physical exam  Vitals:   01/04/24 2000 01/04/24 2105 01/05/24 0100 01/05/24 0515  BP: 114/70 110/71 105/60 107/66  Pulse: 72 63 60 60  Resp: 18 18 18 18   Temp: 98.5 F (36.9 C) 98.6 F (37 C) 98.4 F (36.9 C) 98.7 F (37.1 C)  TempSrc:  Oral Oral Oral Oral  SpO2: 98% 97% 96% 97%  Weight:      Height:       General: alert, cooperative, and no distress Lochia: appropriate Uterine Fundus: firm Incision: N/A DVT Evaluation: No evidence of DVT seen on physical exam. Labs: Lab Results  Component Value Date   WBC 11.0 (H) 01/05/2024   HGB 10.3 (L) 01/05/2024   HCT 30.1 (L) 01/05/2024   MCV 87.2 01/05/2024   PLT 169 01/05/2024      Latest Ref Rng & Units 01/05/2023   10:42 AM  CMP  Glucose 65 - 99 mg/dL 81   BUN 7 - 25 mg/dL 15   Creatinine 9.49 - 0.97 mg/dL 9.38   Sodium 864 - 853 mmol/L 140   Potassium 3.5 - 5.3 mmol/L 4.4   Chloride 98 - 110 mmol/L 105   CO2 20 - 32 mmol/L 27   Calcium 8.6 - 10.2 mg/dL 9.6   Total Protein 6.1 - 8.1 g/dL 7.4   Total Bilirubin 0.2 - 1.2 mg/dL 0.3   AST 10 - 30 U/L 10   ALT 6 - 29 U/L 11    Edinburgh Score:    01/05/2024    5:15 AM  Edinburgh Postnatal Depression Scale Screening Tool  I have been able to laugh and see the funny side of things. 0  I have looked forward with enjoyment to things. 0  I have blamed myself unnecessarily when things went wrong. 0  I have been anxious or worried for no good reason. 0  I have felt scared or panicky for no good reason. 0  Things have been getting on top of me. 0  I have been so unhappy that I have had difficulty sleeping. 0  I have felt sad or miserable. 0  I have been so unhappy that I have been crying. 0  The thought of harming myself has occurred to me. 0  Edinburgh Postnatal Depression Scale Total 0      After visit meds:  Allergies as of 01/05/2024       Reactions   Penicillins Rash   Has patient had a PCN reaction causing immediate rash, facial/tongue/throat swelling, SOB or lightheadedness with hypotension: Yes Has patient had a PCN reaction causing severe rash involving mucus membranes or skin necrosis: Yes Has patient had a PCN reaction that required hospitalization No Has patient had a PCN reaction occurring  within the last 10 years: No If all of the above answers are NO, then may proceed with Cephalosporin use.        Medication List     STOP taking these medications    levonorgestrel 20 MCG/24HR IUD Commonly known as: MIRENA   ondansetron  4 MG disintegrating tablet Commonly known as: ZOFRAN -ODT   pantoprazole  40 MG tablet Commonly known as: PROTONIX    phentermine  37.5 MG tablet Commonly known as: ADIPEX-P    VITAMIN D  PO       TAKE these medications    cholecalciferol 25 MCG (1000 UNIT) tablet Commonly known as: VITAMIN D3 Take 1,000 Units by mouth daily.   cyanocobalamin  1000 MCG tablet Commonly known as: VITAMIN B12 Take 1,000 mcg by mouth daily.   ibuprofen  600 MG tablet Commonly known as: ADVIL  Take 1 tablet (600 mg total) by mouth every 6 (six) hours.   prenatal multivitamin Tabs tablet Take 1 tablet by mouth daily at 12 noon.         Discharge home in stable condition Infant Feeding: n/a Infant Disposition: n/a Discharge instruction: per After Visit Summary and Postpartum booklet. Activity: Advance as tolerated. Pelvic rest for 6 weeks.  Diet: routine diet Anticipated Birth Control: Unsure Postpartum Appointment:4 weeks Additional Postpartum F/U: n/a Future Appointments:No future appointments. Follow up Visit:  Follow-up Information     Ob/Gyn, Landy Stains Follow up in 4 week(s).   Contact information: [address] [place] [address] [phone]                     01/05/2024 [facility] VELNA GASKINS, MD

## 2024-01-05 NOTE — Anesthesia Postprocedure Evaluation (Signed)
 Anesthesia Post Note  Patient: Crystal Abbott  Procedure(s) Performed: AN AD HOC LABOR EPIDURAL     Patient location during evaluation: Mother Baby Anesthesia Type: Epidural Level of consciousness: awake and alert and oriented Pain management: satisfactory to patient Vital Signs Assessment: post-procedure vital signs reviewed and stable Respiratory status: respiratory function stable Cardiovascular status: stable Postop Assessment: no headache, no backache, epidural receding, patient able to bend at knees, no signs of nausea or vomiting, adequate PO intake and able to ambulate Anesthetic complications: no   No notable events documented.  Last Vitals:  Vitals:   01/05/24 0100 01/05/24 0515  BP: 105/60 107/66  Pulse: 60 60  Resp: 18 18  Temp: 36.9 C 37.1 C  SpO2: 96% 97%    Last Pain:  Vitals:   01/05/24 0515  TempSrc: Oral  PainSc: 3    Pain Goal:                   Harry Shuck

## 2024-01-05 NOTE — Progress Notes (Signed)
 Patient is doing well.  She is ambulating, voiding, tolerating PO.  Pain control is good.  Lochia is appropriate  Vitals:   01/04/24 2000 01/04/24 2105 01/05/24 0100 01/05/24 0515  BP: 114/70 110/71 105/60 107/66  Pulse: 72 63 60 60  Resp: 18 18 18 18   Temp: 98.5 F (36.9 C) 98.6 F (37 C) 98.4 F (36.9 C) 98.7 F (37.1 C)  TempSrc: Oral Oral Oral Oral  SpO2: 98% 97% 96% 97%  Weight:      Height:        NAD Fundus firm Ext: trace edema bilaterally  Lab Results  Component Value Date   WBC 11.0 (H) 01/05/2024   HGB 10.3 (L) 01/05/2024   HCT 30.1 (L) 01/05/2024   MCV 87.2 01/05/2024   PLT 169 01/05/2024    --/--/B POS (09/30 1456)  A/P 33 y.o. G3P3003 PPD#1 s/p SVD. Routine postpartum care. Expect d/c today, meeting all goals.    [facility] GEFFEL [employer]

## 2024-01-06 LAB — BIRTH TISSUE RECOVERY COLLECTION (PLACENTA DONATION)

## 2024-01-12 ENCOUNTER — Telehealth (HOSPITAL_COMMUNITY): Payer: Self-pay

## 2024-01-12 ENCOUNTER — Inpatient Hospital Stay (HOSPITAL_COMMUNITY): Admit: 2024-01-12

## 2024-01-12 NOTE — Telephone Encounter (Signed)
 01/12/2024 [id number]  Name: Crystal Abbott MRN: [id number] DOB: May 08, 1990  Reason for Call:  Transition of Care Hospital Discharge Call  Contact Status: Patient Contact Status: Complete  Language assistant needed:          Follow-Up Questions: Do You Have Any Concerns About Your Health As You Heal From Delivery?: No  Edinburgh Postnatal Depression Scale:  In the Past 7 Days: I have been able to laugh and see the funny side of things.: As much as I always could I have looked forward with enjoyment to things.: As much as I ever did I have blamed myself unnecessarily when things went wrong.: No, never I have been anxious or worried for no good reason.: No, not at all I have felt scared or panicky for no good reason.: No, not at all Things have been getting on top of me.: No, I have been coping as well as ever I have been so unhappy that I have had difficulty sleeping.: Not at all I have felt sad or miserable.: No, not at all I have been so unhappy that I have been crying.: No, never The thought of harming myself has occurred to me.: Never Edinburgh Postnatal Depression Scale Total: 0  PHQ2-9 Depression Scale:     Discharge Follow-up: Edinburgh score requires follow up?: No  Post-discharge interventions: NA  Signature  Rosaline Deretha PEAK

## 2024-01-21 ENCOUNTER — Encounter (HOSPITAL_COMMUNITY)

## 2024-02-04 DIAGNOSIS — Z1331 Encounter for screening for depression: Secondary | ICD-10-CM | POA: Diagnosis not present
